# Patient Record
Sex: Male | Born: 1975 | Race: White | Hispanic: No | Marital: Married | Smoking: Never smoker
Health system: Southern US, Community
[De-identification: ages and names within clinical notes are randomized; demographics above are authoritative.]

## PROBLEM LIST (undated history)

## (undated) DIAGNOSIS — E785 Hyperlipidemia, unspecified: Secondary | ICD-10-CM

## (undated) DIAGNOSIS — K219 Gastro-esophageal reflux disease without esophagitis: Secondary | ICD-10-CM

## (undated) DIAGNOSIS — S46212A Strain of muscle, fascia and tendon of other parts of biceps, left arm, initial encounter: Secondary | ICD-10-CM

## (undated) DIAGNOSIS — M6740 Ganglion, unspecified site: Secondary | ICD-10-CM

## (undated) HISTORY — DX: Hyperlipidemia, unspecified: E78.5

## (undated) HISTORY — DX: Gastro-esophageal reflux disease without esophagitis: K21.9

---

## 2009-08-03 NOTE — Progress Notes (Signed)
Ambulatory/Rehab Services H2 Model Falls Risk Assessment    Risk Factor Pts. ??   Confusion/Disorientation/Impulsivity  []   4 ??   Symptomatic Depression  []  2 ??   Altered Elimination  []  1 ??   Dizziness/Vertigo  []  1 ??   Gender (Male)  [x]  1 ??   Any administered antiepileptics (anticonvulsants):  []  2 ??   Any administered benzodiazepines:  []  1 ??   Visual Impairment (specify):  []  1 ??   Portable Oxygen Use  []  1 ??   Orthostatic ? BP  []  1 ??   History of Recent Falls (within 3 mos.)  []  5     Ability to Rise from Chair (choose one) Pts. ??   Ability to rise in a single movement  [x]  0 ??   Pushes up, successful in one attempt  []  1 ??   Multiple attempts, but unsuccessful  []  3 ??   Unable to rise without assistance  []  4   Total: (5 or greater = High Risk) 1     Falls Prevention Plan:   []               Physical Limitations to Exercise (specify):   []               Mobility Assistance Device (type):   []               Exercise/Equipment Adaptation (specify):    ??2010 [employer] All Rights Reserved. [place] Patent #[id number]. Federal Law prohibits the replication, distribution or use without written permission from [employer]

## 2009-08-03 NOTE — Progress Notes (Signed)
[facility] [address]  [address], [address]   [place], [place] [address]  Phone: [phone]   Fax: [phone]    OUTPATIENT ORTHOPAEDIC PHYSICAL THERAPY  [x]   Initial Assessment     []   Daily Note      []   Progress Report       []   Recertification    []   Discharge  []   [name] Fall Risk Score: 1 (5 or greater = High Risk)    NAME/AGE/GENDER: Ronald Fields is a 34 y.o. male  DATE: 08/03/2009  REFERRING PHYSICIAN: Dr. Marlana Fields  Return Physician Appointment: 2 weeks  MEDICAL/REFERRING DIAGNOSIS: left soleus strain  Treatment Diagnosis: 844.8  DATE OF ONSET: 06/25/09   PRIOR LEVEL OF FUNCTION: independent  PRECAUTIONS/ALLERGIES: none reported    SUBJECTIVE     Present Symptoms: Pt complains of intermittent pain posterior left calf. Pt reports difficulty with stairs and prolonged walking.    Pain Intensity 1: 0 (0 Fields   5 at worst)  Pain Location 1: Leg  Pain Orientation 1: Distal;Left;Posterior  History of Present Injury/Illness: Pt reports that he was playing in a rugby match 06/25/09 when he had sudden onset of pain in posterior left calf while running straight line. Pt reports re-injury in another match on 07/16/09. Pt also reports similar injury on the right side in 2009.   Past Medical History: non-contributory  Fields Medications: Ibuprofen   Social History/Home Situation: lives with family      Work/Activity History: Copywriter, advertising    OBJECTIVE     Outcome Measure:   Tool Used: Lower Extremity Functional Scale (LEFS)  Score:  Initial: 57/80   08/03/09 Most Recent: /80   Interpretation of Score: 20 questions each scored on a 5 point scale with 1 representing "extreme difficulty or unable to perform" and 5 representing "no difficulty".  The lower the score, the greater the functional disability. 80/80 represents no disability.  Minimal detectable change is 9 points.  Observation/Orthostatic Postural Assessment: no significant deviations noted       Palpation: mild tenderness lateral posterior calf      ROM:               LLE AROM  L Knee Flexion: 135   L Knee Extension: 0   L Ankle Dorsiflexion: 20  (mild calf discomfort)  L Ankle Plantar Flexion: 50   L Ankle Eversion: 15   L Ankle Inversion: 20     RLE AROM  R Knee Flexion: 135   R Knee Extension: 0   R Ankle Dorsiflexion: 20   R Ankle Plantar Flexion: 50   R Ankle Eversion: 15   R Ankle Inversion: 20       Strength:      LLE Strength  L Knee Flexion: 5  L Knee Extension: 5  L Ankle Dorsiflexion: 4+  L Ankle Plantar Flexion: 4+  L Ankle Eversion: 5  L Ankle Inversion: 5    RLE Strength  R Knee Flexion: 5  R Knee Extension: 5  R Ankle Dorsiflexion: 5  R Ankle Plantar Flexion: 5  R Ankle Eversion: 5  R Ankle Inversion: 5      Special Tests:   Neurological Screen:   Myotomes: intact  Dermatomes: intact  Functional Mobility: minimal antalgic gait on the left      Balance: good        TREATMENT     Manual Therapy (  0):   Therapeutic Exercise ( 10'):   Activity   Date  08/03/09 Date    Parameters Parameters   Toe alphabet x1    Towel scrunches 2-3'    Ankle pumps X 10    Ankle circles X 10    Seated toe raises X 10    Seated heel raises X 10    Incline calf stretch 5 x 10"    HEP: As above; handouts given to patient for all exercises.  Therapeutic Modalities: Calf                                           Left Ankle Electrical Stimulation  Type: Interferential  Intensity: 20   Placement: posterior calf  # of Electrodes: 4   Duration : 12 minutes  Patient Position: Prone                                                    ASSESSMENT     [name] presents with signs and symptoms consistent with left soleus strain. Pt demonstrates good understanding of initial HEP. Pt may benefit from PT to address soft tissue elongation and return to functional activity to include rugby.    Response:  Patient???s response to today???s treatment session was tolerated well with no complications.  Upon completion of treatment, skin condition was clear.  Progression: very likely to progress.     Compliance with Program/Exercises: Will assess as treatment progresses.   Other Comments:    **This section established at most recent assessment**  PROBLEM LIST (Impairments causing functional limitations):  1.  Pain posterior left calf  2.  Pain at end range of left ankle dorsiflexion and plantarflexion.  3.  Tenderness left gastroc/soleus.      GOALS: (Goals have been discussed and agreed upon with patient.)  SHORT-TERM FUNCTIONAL GOALS: Time Frame: 2 weeks  1.  Pain rating of 2-3 with stair climbing and prolonged walking  2.  Minimal antalgic gait  3.  Independent in initial HEP     DISCHARGE GOALS: Time Frame: 4 weeks  1.  Minimal pain with stair climbing and prolonged walking and return to rugby  2.  Normalized gait  3.  Independent in advanced HEP     REHABILITATION POTENTIAL FOR STATED GOALS: Excellent     PLAN OF CARE     INTERVENTIONS PLANNED: (Benefits and precautions of physical therapy have been discussed with the patient.)  1.  Electrical stimulation for pain control and muscle relaxation  2.  Therapeutic exercise for calf stabilization   3.  Manual therapy for muscle elongation and joint flexibility    TREATMENT PLAN EFFECTIVE DATES: 08/03/09 TO 09/02/09  PLAN: Continue to follow patient 2 times a week for 4 weeks to address above goals.  Recommendations/Intent for next treatment session:  Initiate calf stabilization exercise program to include recumbent bike and modalities as above.  PT Patient Time In/Time Out  Time In: 0900  Time Out: 1000    Regarding Ronald Fields's therapy, I certify that the treatment plan above will be carried out by a therapist or under their direction.  Thank you for this referral,  Randon Goldsmith, PT  Physician Signature      Date

## 2009-08-08 NOTE — Progress Notes (Signed)
[facility] at [facility] [address]  [address]   [place], [place] [address]  Phone: [phone]   Fax: [phone]    OUTPATIENT ORTHOPAEDIC PHYSICAL THERAPY  []     Initial Assessment     [x]     Daily Note      []     Progress Report       []     Recertification    []     Discharge  []     [name] Fall Risk Score: 1 (5 or greater = High Risk)    NAME/AGE/GENDER: Ronald Fields is a 34 y.o. male  DATE: 08/08/2009  REFERRING PHYSICIAN: Dr. Marlana Latus  Return Physician Appointment: 2 weeks  MEDICAL/REFERRING DIAGNOSIS: left soleus strain  Treatment Diagnosis: 844.8  DATE OF ONSET: 06/25/09   PRIOR LEVEL OF FUNCTION: independent  PRECAUTIONS/ALLERGIES: none reported    SUBJECTIVE     Present Symptoms: Pt reports mild discomfort first thing in AM, but improves with mobility.  Pt states he did some light jogging last week at rugby with mild discomfort.  Pain Intensity 1: 1 (1 in AM     3 with light jog)  Pain Location 1: Leg  Pain Orientation 1: Distal;Left  History of Present Injury/Illness: Pt reports that he was playing in a rugby match 06/25/09 when he had sudden onset of pain in posterior left calf while running straight line. Pt reports re-injury in another match on 07/16/09. Pt also reports similar injury on the right side in 2009.   Past Medical History: non-contributory  Current Medications: Ibuprofen   Social History/Home Situation: lives with family      Work/Activity History: Copywriter, advertising    OBJECTIVE     Outcome Measure:   Tool Used: Lower Extremity Functional Scale (LEFS)  Score:  Initial: 57/80   08/03/09 Most Recent: /80   Interpretation of Score: 20 questions each scored on a 5 point scale with 1 representing "extreme difficulty or unable to perform" and 5 representing "no difficulty".  The lower the score, the greater the functional disability. 80/80 represents no disability.  Minimal detectable change is 9 points.   Observation/Orthostatic Postural Assessment: no significant deviations noted      Palpation: mild tenderness lateral posterior calf      ROM: NT                          Strength: Nt                 Special Tests:   Neurological Screen:   Myotomes: intact  Dermatomes: intact  Functional Mobility: minimal antalgic gait on the left      Balance: good        TREATMENT     Manual Therapy (     0):   Therapeutic Exercise ( 30'):   Activity   Date  08/03/09 Date  08/08/09    Parameters Parameters   Toe alphabet x1    Towel scrunches 2-3'    Ankle pumps X 10    Ankle circles X 10    Seated toe raises X 10 X 10   Seated heel raises X 10 X 10   Incline calf stretch 5 x 10" 5 x 10"   Recumbent bike  X 10'   Anterior step ups  2 x 10   Lateral step ups  2 x 10   SLS rebounder on pad  2 x 10  Ankle theraband  Blue 4 x 10   Small Fitter disc  Ant/post, lat, and circles  X 10 each   HEP: As above; handouts given to patient for all exercises.  Therapeutic Modalities: (12') Modalities: IFC electrical stimulation to left calf at intensity of 15 while prone.                                                                                                ASSESSMENT     Ronald Fields is able to progress to resistive exercise today with minimal difficulty .   Response:  Patient???s response to today???s treatment session was tolerated well with no complications.  Upon completion of treatment, skin condition was clear.  Progression: very likely to progress.     Compliance with Program/Exercises: Will assess as treatment progresses.   Other Comments:    **This section established at most recent assessment**  PROBLEM LIST (Impairments causing functional limitations):  1.  Pain posterior left calf  2.  Pain at end range of left ankle dorsiflexion and plantarflexion.  3.  Tenderness left gastroc/soleus.      GOALS: (Goals have been discussed and agreed upon with patient.)  SHORT-TERM FUNCTIONAL GOALS: Time Frame: 2 weeks   1.  Pain rating of 2-3 with stair climbing and prolonged walking  2.  Minimal antalgic gait  3.  Independent in initial HEP     DISCHARGE GOALS: Time Frame: 4 weeks  1.  Minimal pain with stair climbing and prolonged walking and return to rugby  2.  Normalized gait  3.  Independent in advanced HEP     REHABILITATION POTENTIAL FOR STATED GOALS: Excellent     PLAN OF CARE     INTERVENTIONS PLANNED: (Benefits and precautions of physical therapy have been discussed with the patient.)  1.  Electrical stimulation for pain control and muscle relaxation  2.  Therapeutic exercise for calf stabilization   3.  Manual therapy for muscle elongation and joint flexibility    TREATMENT PLAN EFFECTIVE DATES: 08/03/09 TO 09/02/09  PLAN: Continue to follow patient 2 times a week for 4 weeks to address above goals.  Recommendations/Intent for next treatment session:  Continue calf stabilization exercise program to include recumbent bike and modalities as above.  PT Patient Time In/Time Out  Time In: 0900  Time Out: 0945    Regarding Ronald Fields's therapy, I certify that the treatment plan above will be carried out by a therapist or under their direction.  Thank you for this referral,  Randon Goldsmith, PT

## 2009-08-17 NOTE — Progress Notes (Signed)
Patient cancelled appointment today, and is rescheduled for later date.    Ronald Fields, PT.

## 2009-09-20 NOTE — Progress Notes (Signed)
[facility] at [facility] [address]  [address]   [place], [place] [address]  Phone: [phone]   Fax: [phone]    OUTPATIENT ORTHOPAEDIC PHYSICAL THERAPY  []      Initial Assessment     []      Daily Note      []      Progress Report       []      Recertification    [x]      Discharge  []      [name] Fall Risk Score: 1 (5 or greater = High Risk)    NAME/AGE/GENDER: Ronald Fields is a 34 y.o. male  DATE: 08/17/2009  REFERRING PHYSICIAN: Dr. Marlana Latus  Return Physician Appointment: 2 weeks  MEDICAL/REFERRING DIAGNOSIS: left soleus strain     DATE OF ONSET: 06/25/09   PRIOR LEVEL OF FUNCTION: independent  PRECAUTIONS/ALLERGIES: none reported    SUBJECTIVE     Present Symptoms: Pt completed 2 sessions of PT and was not seen after appointment 08/08/09.Pt called this week to report that Dr. Marlana Latus had discharged him and would not be continuing PT.     History of Present Injury/Illness: Pt reports that he was playing in a rugby match 06/25/09 when he had sudden onset of pain in posterior left calf while running straight line. Pt reports re-injury in another match on 07/16/09. Pt also reports similar injury on the right side in 2009.   Past Medical History: non-contributory  Current Medications: Ibuprofen   Social History/Home Situation: lives with family      Work/Activity History: Copywriter, advertising    OBJECTIVE     Outcome Measure:   Tool Used: Lower Extremity Functional Scale (LEFS)  Score:  Initial: 57/80   08/03/09 Most Recent: /80 unable to assess 09/20/09   Interpretation of Score: 20 questions each scored on a 5 point scale with 1 representing "extreme difficulty or unable to perform" and 5 representing "no difficulty".  The lower the score, the greater the functional disability. 80/80 represents no disability.  Minimal detectable change is 9 points.  Observation/Orthostatic Postural Assessment: no significant deviations noted       Palpation: mild tenderness lateral posterior calf      ROM: NT                          Strength: Nt                 Special Tests:   Neurological Screen:   Myotomes: intact  Dermatomes: intact  Functional Mobility: minimal antalgic gait on the left      Balance: good        TREATMENT     Manual Therapy (     0):   Therapeutic Exercise ( 30'):   Activity   Date  08/03/09 Date  08/08/09    Parameters Parameters   Toe alphabet x1    Towel scrunches 2-3'    Ankle pumps X 10    Ankle circles X 10    Seated toe raises X 10 X 10   Seated heel raises X 10 X 10   Incline calf stretch 5 x 10" 5 x 10"   Recumbent bike  X 10'   Anterior step ups  2 x 10   Lateral step ups  2 x 10   SLS rebounder on pad  2 x 10   Ankle theraband  Blue 4 x 10  Small Fitter disc  Ant/post, lat, and circles  X 10 each   HEP: As above; handouts given to patient for all exercises.  Therapeutic Modalities: (12') Modalities: IFC electrical stimulation to left calf at intensity of 15 while prone.                                                                                                ASSESSMENT     [name] reported return to full activities and he would not be returning.   Response:  Patient???s response to today???s treatment session was tolerated well with no complications.  Upon completion of treatment, skin condition was clear.  Progression: good    Compliance with Program/Exercises: 2/3 appointments   Other Comments:    **This section established at most recent assessment**  PROBLEM LIST (Impairments causing functional limitations):  1.  Pain posterior left calf  2.  Pain at end range of left ankle dorsiflexion and plantarflexion.  3.  Tenderness left gastroc/soleus.      GOALS: (Goals have been discussed and agreed upon with patient.)  SHORT-TERM FUNCTIONAL GOALS: Time Frame: 2 weeks  1.  Pain rating of 2-3 with stair climbing and prolonged walking Unable to assess 09/20/09  2.  Minimal antalgic gait Unable to assess 09/20/09   3.  Independent in initial HEP Unable to assess 09/20/09     DISCHARGE GOALS: Time Frame: 4 weeks  1.  Minimal pain with stair climbing and prolonged walking and return to rugby Unable to assess 09/20/09  2.  Normalized gait Unable to assess 09/20/09  3.  Independent in advanced HEP   Unable to assess 09/20/09  REHABILITATION POTENTIAL FOR STATED GOALS: Excellent     PLAN OF CARE     TREATMENT DATES: 08/03/09 TO 08/08/09   2/3 appointments  PLAN: Patient is discharged from active PT as he has failed to return for further treatment since last visit 08/08/09, and now reports he has been D/C'd by physician.     Regarding Ronald Fields's therapy, I certify that the treatment plan above will be carried out by a therapist or under their direction.  Thank you for this referral,  Randon Goldsmith, PT

## 2009-10-25 NOTE — Progress Notes (Signed)
[facility] at [facility]  [address] [place], [place] [address]  Phone:[phone]    Fax:[phone]    OUTPATIENT ORTHOPAEDIC PHYSICAL THERAPY  [x] Initial Assessment     [] Daily Note      [] Progress Report       [] Recertification    [] Discharge  [] [name] Fall Risk Score:2 (5 or greater = High Risk)    NAME/AGE/GENDER: Ronald Fields is a 34 y.o. male  Treatment Diagnosis: other disorders of muscle ligament and fascia  DATE: 10/25/2009  REFERRING PHYSICIAN: Dr. Marlana Latus  MD Orders: Eval and Treat  Return Physician Appointment: as needed  MEDICAL/REFERRING DIAGNOSIS: left calf pain  DATE OF ONSET: 10/19/09   PRIOR LEVEL OF FUNCTION: independent  PRECAUTIONS/ALLERGIES: none reported    SUBJECTIVE     Present Symptoms: Pt complains of significant pain in posterior left calf. Pain is more medial than lateral. Pain is worse in weight bearing.   Pain Intensity 1: 4 (4 current   6 at worst)  Pain Location 1: Leg  Pain Orientation 1: Distal;Left;Posterior  History of Present Injury/Illness: Pt reports that he was playing in a rugby match 06/25/09 when he had sudden onset of pain in posterior left calf while running straight line. Pt reports re-injury in another match on 07/16/09. Pt also reports similar injury on the right side in 2009. Pt completed 2 sessions of PT in this facility. Pt reports he returned to rugby, and during straight line running at practice had sudden sharp return of pain in left calf. Pt states MRI indicated partial tear left gastroc.  Past Medical History: non-contributory  Current Medications: Ibuprofen   Social History/Home Situation: lives with family      Work/Activity History: Copywriter, advertising    OBJECTIVE     Outcome Measure:   Tool Used: Lower Extremity Functional Scale (LEFS)  Score:  Initial: --/80 Most Recent: --/80    Interpretation of Score: 20 questions each scored on a 5 point scale with 1 representing "extreme difficulty or unable to perform" and 5 representing "no difficulty".  The lower the score, the greater the functional disability. 80/80 represents no disability.  Minimal detectable change is 9 points.  Observation/Orthostatic Postural Assessment: Pt with noted inversion drift for left foot      Palpation: marked tenderness over medial left gastroc/soleius      ROM:               LLE AROM  L Knee Flexion: 135   L Knee Extension: 0   L Ankle Dorsiflexion: 10   L Ankle Plantar Flexion: 40   L Ankle Eversion: 15   L Ankle Inversion: 30     RLE AROM  R Knee Flexion: 135   R Knee Extension: 0   R Ankle Dorsiflexion: 20   R Ankle Plantar Flexion: 50   R Ankle Eversion: 15   R Ankle Inversion: 35       Strength:      LLE Strength  L Knee Flexion: 5  L Knee Extension: 5  L Ankle Dorsiflexion: 4  L Ankle Plantar Flexion: 4  L Ankle Eversion: 4  L Ankle Inversion: 4    RLE Strength  R Knee Flexion: 5  R Knee Extension: 5  R Ankle Dorsiflexion: 5  R Ankle Plantar Flexion: 5  R Ankle Eversion: 5  R Ankle Inversion: 5      Special Tests:   Neurological Screen:   Myotomes: intact  Dermatomes:intact  Functional Mobility: marked antalgic  gait left LE      Balance: fair        TREATMENT   Evaluation ( 40 min)  Manual Therapy (     0):   Therapeutic Exercise ( 10'):   Activity   Date  10/25/09 Date    Parameters Parameters   Seated heel raises X 10    Seated toe raises X 10    Seated towel calf stretch 5 x 5"    Standing gastroc stretch 5 x 5"    Standing soleus stretch 5 x 5"               HEP: As above; handouts given to patient for all exercises.  Therapeutic Modalities: (0)                                                                                                ASSESSMENT      Ronald Fields presents with acute pain in posterior distal left LE which is consistent with partial tear of the left gastroc. This is the 3rd injury to left calf in 4 months. Pt may benefit from PT to address pain control, ROM and leg/ankle strengthening to return to daily functional activities including rugby.  Response:  Patient???s response to today???s treatment session was tolerated well with no complications.  Upon completion of treatment, skin condition was clear.  Progression: very likely to progress.     Compliance with Program/Exercises: Will assess as treatment progresses.   Other Comments:    **This section established at most recent assessment**  PROBLEM LIST (Impairments causing functional limitations):  1.  Pain in left calf  2.  Decreased ROM left ankle  3.  Decreased strength left ankle  4.  Antalgic gait  GOALS: (Goals have been discussed and agreed upon with patient.)  SHORT-TERM FUNCTIONAL GOALS: Time Frame: 4 weeks  1.  Decrease pain to 2-3 with walking  2.  Full PROM left ankle  3.  Increase strength 1/2 grade  4.  Mild antalgic gait  DISCHARGE GOALS: Time Frame: 6 weeks  1.  Decrease pain to minimal with running  2.  Full AROM left ankle  3.  Increase strength 1 grade  4.  Minimal antalgic gait  REHABILITATION POTENTIAL FOR STATED GOALS: Good     PLAN OF CARE     INTERVENTIONS PLANNED: (Benefits and precautions of physical therapy have been discussed with the patient.)  1.  Electrical stimulation for pain control and muscle relaxation  2.  Therapeutic exercise for LE stabilization   3.  Manual therapy for muscle elongation and joint flexibility    TREATMENT PLAN EFFECTIVE DATES: 10/25/09 TO 12/13/09  PLAN: Continue to follow patient 2 times a week for 6 weeks to address above goals.  Recommendations/Intent for next treatment session:  Initiate ankle stabilization exercise program to include recumbent bike and modalities as above.  PT Patient Time In/Time Out  Time In: 1100  Time Out: 1200     Regarding Ronald Fields's therapy, I certify that the treatment plan above will be carried out by a therapist or under their  direction.  Thank you for this referral,  Randon Goldsmith, PT                      Physician Signature      Date

## 2009-10-26 NOTE — Progress Notes (Signed)
[facility] at [facility]  [address] [place], [place] [address]  [phone]    [phone]    OUTPATIENT ORTHOPAEDIC PHYSICAL THERAPY  []  Initial Assessment     [x]  Daily Note      []  Progress Report       []  Recertification    []  Discharge  []  [name] Fall Risk Score:2 (5 or greater = High Risk)    NAME/AGE/GENDER: Ronald Fields is a 34 y.o. male     DATE: 10/26/2009  REFERRING PHYSICIAN: Dr. Marlana Latus  MD Orders: Eval and Treat  Return Physician Appointment: as needed  MEDICAL/REFERRING DIAGNOSIS: left calf pain  DATE OF ONSET: 10/19/09   PRIOR LEVEL OF FUNCTION: independent  PRECAUTIONS/ALLERGIES: none reported    SUBJECTIVE     Present Symptoms: Pt reports mild improvement in pain in posterior left calf. Gait is less antalgic.   Pain Intensity 1: 3  Pain Location 1: Leg  Pain Orientation 1: Distal;Left;Posterior  History of Present Injury/Illness: Pt reports that he was playing in a rugby match 06/25/09 when he had sudden onset of pain in posterior left calf while running straight line. Pt reports re-injury in another match on 07/16/09. Pt also reports similar injury on the right side in 2009. Pt completed 2 sessions of PT in this facility. Pt reports he returned to rugby, and during straight line running at practice had sudden sharp return of pain in left calf. Pt states MRI indicated partial tear left gastroc.  Past Medical History: non-contributory  Current Medications: Ibuprofen   Social History/Home Situation: lives with family      Work/Activity History: Copywriter, advertising    OBJECTIVE     Outcome Measure:   Tool Used: Lower Extremity Functional Scale (LEFS)  Score:  Initial: --/80 Most Recent: --/80    Interpretation of Score: 20 questions each scored on a 5 point scale with 1 representing "extreme difficulty or unable to perform" and 5 representing "no difficulty".  The lower the score, the greater the functional disability. 80/80 represents no disability.  Minimal detectable change is 9 points.  Observation/Orthostatic Postural Assessment: Pt with noted inversion drift for left foot      Palpation: marked tenderness over medial left gastroc/soleius      ROM: NT                          Strength: NT                 Special Tests:   Neurological Screen:   Myotomes: intact  Dermatomes:intact  Functional Mobility: marked antalgic gait left LE      Balance: fair        TREATMENT     Manual Therapy (     0):   Therapeutic Exercise ( 30'):   Activity   Date  10/25/09 Date  10/26/09    Parameters Parameters   Seated heel raises X 10 X 10   Seated toe raises X 10 X 10   Seated towel calf stretch 5 x 5" --   Standing gastroc stretch 5 x 5" 5 x 5"   Standing soleus stretch 5 x 5" 5 x 5"   Recumbent bike  Level 2 x 10'   Incline calf  5 x 10"   Step ups ant/lat/post  X 10 each   theraband ankle   DF/PF/INV/Ever  Yellow x 10 each         HEP: As above;  handouts given to patient for all exercises.  Therapeutic Modalities: (12') Modalities: IFC electrical stimulation to left calf at intensity of 23 while prone                                                                                                ASSESSMENT     Mr. Cotto initiates resistive exercise with only mild difficulty. Pt reports mild pain improvement post electrical stimulation.Pt will be out of town next week.  Response:  Patient???s response to today???s treatment session was tolerated well with no complications.  Upon completion of treatment, skin condition was clear.  Progression: very likely to progress.     Compliance with Program/Exercises: Will assess as treatment progresses.   Other Comments:    **This section established at most recent assessment**   PROBLEM LIST (Impairments causing functional limitations):  1.  Pain in left calf  2.  Decreased ROM left ankle  3.  Decreased strength left ankle  4.  Antalgic gait  GOALS: (Goals have been discussed and agreed upon with patient.)  SHORT-TERM FUNCTIONAL GOALS: Time Frame: 4 weeks  1.  Decrease pain to 2-3 with walking  2.  Full PROM left ankle  3.  Increase strength 1/2 grade  4.  Mild antalgic gait  DISCHARGE GOALS: Time Frame: 6 weeks  1.  Decrease pain to minimal with running  2.  Full AROM left ankle  3.  Increase strength 1 grade  4.  Minimal antalgic gait  REHABILITATION POTENTIAL FOR STATED GOALS: Good     PLAN OF CARE     INTERVENTIONS PLANNED: (Benefits and precautions of physical therapy have been discussed with the patient.)  1.  Electrical stimulation for pain control and muscle relaxation  2.  Therapeutic exercise for LE stabilization   3.  Manual therapy for muscle elongation and joint flexibility    TREATMENT PLAN EFFECTIVE DATES: 10/25/09 TO 12/13/09  PLAN: Continue to follow patient 2 times a week for 6 weeks to address above goals.  Recommendations/Intent for next treatment session: Continue ankle stabilization exercise program to include recumbent bike and modalities as above.  PT Patient Time In/Time Out  Time In: 0945  Time Out: 1030    Regarding Vibhav Dubow's therapy, I certify that the treatment plan above will be carried out by a therapist or under their direction.  Thank you for this referral,  Randon Goldsmith, PT

## 2009-10-26 NOTE — Progress Notes (Signed)
Ambulatory/Rehab Services H2 Model Falls Risk Assessment    Risk Factor Pts. ??   Confusion/Disorientation/Impulsivity  []   4 ??   Symptomatic Depression  []  2 ??   Altered Elimination  []  1 ??   Dizziness/Vertigo  []  1 ??   Gender (Male)  [x]  1 ??   Any administered antiepileptics (anticonvulsants):  []  2 ??   Any administered benzodiazepines:  []  1 ??   Visual Impairment (specify):  []  1 ??   Portable Oxygen Use  []  1 ??   Orthostatic ? BP  []  1 ??   History of Recent Falls (within 3 mos.)  []  5     Ability to Rise from Chair (choose one) Pts. ??   Ability to rise in a single movement  []  0 ??   Pushes up, successful in one attempt  [x]  1 ??   Multiple attempts, but unsuccessful  []  3 ??   Unable to rise without assistance  []  4   Total: (5 or greater = High Risk) 2     Falls Prevention Plan:   []               Physical Limitations to Exercise (specify):   []               Mobility Assistance Device (type):   []               Exercise/Equipment Adaptation (specify):    ??2010 [employer] All Rights Reserved. [place] Patent #7,[id number]. Federal Law prohibits the replication, distribution or use without written permission from [employer]

## 2009-11-07 NOTE — Progress Notes (Signed)
Patient fails to show for scheduled appointment.    Matt Brinley Treanor, PT.

## 2009-11-10 NOTE — Progress Notes (Signed)
[facility] at [facility]  [address] [place], [place] [address]  Phone:[phone]    [phone]    OUTPATIENT ORTHOPAEDIC PHYSICAL THERAPY  []   Initial Assessment     [x]   Daily Note      []   Progress Report       []   Recertification    []   Discharge  []   [name] Fall Risk Score:2 (5 or greater = High Risk)    NAME/AGE/GENDER: Ronald Fields is a 34 y.o. male     DATE: 11/10/2009  REFERRING PHYSICIAN: Dr. Marlana Latus  MD Orders: Eval and Treat  Return Physician Appointment: as needed  MEDICAL/REFERRING DIAGNOSIS: left calf pain  DATE OF ONSET: 10/19/09   PRIOR LEVEL OF FUNCTION: independent  PRECAUTIONS/ALLERGIES: none reported    SUBJECTIVE     Present Symptoms: Pt reports he was out of town on vacation.   Pain Intensity 1: 3  Pain Location 1: Leg  Pain Orientation 1: Distal;Left  History of Present Injury/Illness: Pt reports that he was playing in a rugby match 06/25/09 when he had sudden onset of pain in posterior left calf while running straight line. Pt reports re-injury in another match on 07/16/09. Pt also reports similar injury on the right side in 2009. Pt completed 2 sessions of PT in this facility. Pt reports he returned to rugby, and during straight line running at practice had sudden sharp return of pain in left calf. Pt states MRI indicated partial tear left gastroc.  Past Medical History: non-contributory  Current Medications: Ibuprofen   Social History/Home Situation: lives with family      Work/Activity History: Copywriter, advertising    OBJECTIVE     Outcome Measure:   Tool Used: Lower Extremity Functional Scale (LEFS)  Score:  Initial: --/80 Most Recent: --/80   Interpretation of Score: 20 questions each scored on a 5 point scale with 1 representing "extreme difficulty or unable to perform" and 5 representing "no difficulty".  The lower the score, the greater the functional disability. 80/80 represents no disability.  Minimal detectable change is 9 points.   Observation/Orthostatic Postural Assessment: Pt with noted inversion drift for left foot      Palpation: marked tenderness over medial left gastroc/soleius      ROM: NT                          Strength: NT                 Special Tests:   Neurological Screen:   Myotomes: intact  Dermatomes:intact  Functional Mobility: marked antalgic gait left LE      Balance: fair        TREATMENT     Manual Therapy (     0):   Therapeutic Exercise ( 30'):   Activity   Date  10/25/09 Date  10/26/09 Date  11/10/09    Parameters Parameters Parameters   Seated heel raises X 10 X 10 --   Seated toe raises X 10 X 10 --   Seated towel calf stretch 5 x 5" -- --   Standing gastroc stretch 5 x 5" 5 x 5" --   Standing soleus stretch 5 x 5" 5 x 5" --   Recumbent bike  Level 2 x 10' Level 2 x 10'   Incline calf  5 x 10" 5 x 10"   Step ups ant/lat/post  X 10 each X 10 each BLE  theraband ankle   DF/PF/INV/Ever  Yellow x 10 each Blue x 10   Lg Fitter disc  PF/DF, INV/EV, circles   X 10 each   lunge onto BOSU   X 10 BLE   Shuttle leg press   50# x 10   Shuttle calf raise   50# x 10    HEP: As above; handouts given to patient for all exercises.  Therapeutic Modalities: (12') Modalities: IFC electrical stimulation to left calf at intensity of 23 while prone                                                                                                ASSESSMENT     Ronald Fields increases resistive exercise with only mild difficulty. Gait pattern is less antalgic.  Response:  Patient???s response to today???s treatment session was tolerated well with no complications.  Upon completion of treatment, skin condition was clear.  Progression: very likely to progress.     Compliance with Program/Exercises: Will assess as treatment progresses.   Other Comments:    **This section established at most recent assessment**  PROBLEM LIST (Impairments causing functional limitations):  1.  Pain in left calf  2.  Decreased ROM left ankle   3.  Decreased strength left ankle  4.  Antalgic gait  GOALS: (Goals have been discussed and agreed upon with patient.)  SHORT-TERM FUNCTIONAL GOALS: Time Frame: 4 weeks  1.  Decrease pain to 2-3 with walking  2.  Full PROM left ankle  3.  Increase strength 1/2 grade  4.  Mild antalgic gait  DISCHARGE GOALS: Time Frame: 6 weeks  1.  Decrease pain to minimal with running  2.  Full AROM left ankle  3.  Increase strength 1 grade  4.  Minimal antalgic gait  REHABILITATION POTENTIAL FOR STATED GOALS: Good     PLAN OF CARE     INTERVENTIONS PLANNED: (Benefits and precautions of physical therapy have been discussed with the patient.)  1.  Electrical stimulation for pain control and muscle relaxation  2.  Therapeutic exercise for LE stabilization   3.  Manual therapy for muscle elongation and joint flexibility    TREATMENT PLAN EFFECTIVE DATES: 10/25/09 TO 12/13/09  PLAN: Continue to follow patient 2 times a week for 6 weeks to address above goals.  Recommendations/Intent for next treatment session: Continue ankle stabilization exercise program to include recumbent bike and modalities as above.  PT Patient Time In/Time Out  Time In: 0800  Time Out: 0845    Regarding Ronald Fields's therapy, I certify that the treatment plan above will be carried out by a therapist or under their direction.  Thank you for this referral,  Randon Goldsmith, PT

## 2009-11-14 NOTE — Progress Notes (Signed)
[facility] at [facility]  [address] [place], [place] [address]  [phone]    Fax:[phone]    OUTPATIENT ORTHOPAEDIC PHYSICAL THERAPY  []    Initial Assessment     [x]    Daily Note      []    Progress Report       []    Recertification    []    Discharge  []    [name] Fall Risk Score:2 (5 or greater = High Risk)    NAME/AGE/GENDER: Ronald Fields is a 34 y.o. male     DATE: 11/14/2009  REFERRING PHYSICIAN: Dr. Marlana Latus  MD Orders: Eval and Treat  Return Physician Appointment: as needed  MEDICAL/REFERRING DIAGNOSIS: left calf pain  DATE OF ONSET: 10/19/09   PRIOR LEVEL OF FUNCTION: independent  PRECAUTIONS/ALLERGIES: none reported    SUBJECTIVE     Present Symptoms: Pt reports soreness now mainly in AM.   Pain Intensity 1: 2  Pain Location 1: Leg  Pain Orientation 1: Distal;Left  History of Present Injury/Illness: Pt reports that he was playing in a rugby match 06/25/09 when he had sudden onset of pain in posterior left calf while running straight line. Pt reports re-injury in another match on 07/16/09. Pt also reports similar injury on the right side in 2009. Pt completed 2 sessions of PT in this facility. Pt reports he returned to rugby, and during straight line running at practice had sudden sharp return of pain in left calf. Pt states MRI indicated partial tear left gastroc.  Past Medical History: non-contributory  Current Medications: Ibuprofen   Social History/Home Situation: lives with family      Work/Activity History: Copywriter, advertising    OBJECTIVE     Outcome Measure:   Tool Used: Lower Extremity Functional Scale (LEFS)  Score:  Initial: --/80 Most Recent: --/80   Interpretation of Score: 20 questions each scored on a 5 point scale with 1 representing "extreme difficulty or unable to perform" and 5 representing "no difficulty".  The lower the score, the greater the functional disability. 80/80 represents no disability.  Minimal detectable change is 9 points.   Observation/Orthostatic Postural Assessment: Pt with noted inversion drift for left foot      Palpation: mild tenderness over medial left gastroc/soleius      ROM: NT                          Strength: NT                 Special Tests:   Neurological Screen:   Myotomes: intact  Dermatomes:intact  Functional Mobility: mild antalgic gait left LE      Balance: fair        TREATMENT     Manual Therapy (     0):   Therapeutic Exercise ( 30'):   Activity   Date  10/25/09 Date  10/26/09 Date  11/10/09 Date  11/14/09    Parameters Parameters Parameters Parameters   Seated heel raises X 10 X 10 -- --   Seated toe raises X 10 X 10 -- --   Seated towel calf stretch 5 x 5" -- -- --   Standing gastroc stretch 5 x 5" 5 x 5" -- --   Standing soleus stretch 5 x 5" 5 x 5" -- --   Recumbent bike  Level 2 x 10' Level 2 x 10' Level 2 x 10'   Incline calf  5 x 10" 5  x 10" 5 x 10"   Step ups ant/lat/post  X 10 each X 10 each BLE X 10 each BLE   theraband ankle   DF/PF/INV/Ever  Yellow x 10 each Blue x 10 Blue x 10   Lg Fitter disc  PF/DF, INV/EV, circles   X 10 each X 10 each   lunge onto BOSU   X 10 BLE X 10 BLE   Shuttle leg press   50# x 10 50# x 10   Shuttle calf raise   50# x 10 50# x 10   Shuttle leg thrusts    50# x 10    HEP: As above; handouts given to patient for all exercises.  Therapeutic Modalities: (12') Modalities: IFC electrical stimulation to left calf at intensity of 23 while prone                                                                                                ASSESSMENT     [name] continues to demonstrate steady improvement in gait pattern.Tenderness is also significantly improved today.  Response:  Patient???s response to today???s treatment session was tolerated well with no complications.  Upon completion of treatment, skin condition was clear.  Progression: very likely to progress.     Compliance with Program/Exercises: Will assess as treatment progresses.   Other Comments:     **This section established at most recent assessment**  PROBLEM LIST (Impairments causing functional limitations):  1.  Pain in left calf  2.  Decreased ROM left ankle  3.  Decreased strength left ankle  4.  Antalgic gait  GOALS: (Goals have been discussed and agreed upon with patient.)  SHORT-TERM FUNCTIONAL GOALS: Time Frame: 4 weeks  1.  Decrease pain to 2-3 with walking  2.  Full PROM left ankle  3.  Increase strength 1/2 grade  4.  Mild antalgic gait  DISCHARGE GOALS: Time Frame: 6 weeks  1.  Decrease pain to minimal with running  2.  Full AROM left ankle  3.  Increase strength 1 grade  4.  Minimal antalgic gait  REHABILITATION POTENTIAL FOR STATED GOALS: Good     PLAN OF CARE     INTERVENTIONS PLANNED: (Benefits and precautions of physical therapy have been discussed with the patient.)  1.  Electrical stimulation for pain control and muscle relaxation  2.  Therapeutic exercise for LE stabilization   3.  Manual therapy for muscle elongation and joint flexibility    TREATMENT PLAN EFFECTIVE DATES: 10/25/09 TO 12/13/09  PLAN: Continue to follow patient 2 times a week for 6 weeks to address above goals.  Recommendations/Intent for next treatment session: Continue ankle stabilization exercise program to include recumbent bike and modalities as above.  PT Patient Time In/Time Out  Time In: 0845  Time Out: 0930    Regarding Ronald Fields's therapy, I certify that the treatment plan above will be carried out by a therapist or under their direction.  Thank you for this referral,  Ronald Fields, PT

## 2009-11-15 NOTE — Progress Notes (Signed)
[facility] Out[facility] Rehab at [facility]  [address], [place], [place] [address]  [phone]    Fax:[phone]    OUTPATIENT ORTHOPAEDIC PHYSICAL THERAPY  []     Initial Assessment     [x]     Daily Note      []     Progress Report       []     Recertification    []     Discharge  []     [name] Fall Risk Score:2 (5 or greater = High Risk)    NAME/AGE/GENDER: Ronald Fields is a 34 y.o. male     DATE: 11/15/2009  REFERRING PHYSICIAN: Dr. Marlana Latus  MD Orders: Eval and Treat  Return Physician Appointment: as needed  MEDICAL/REFERRING DIAGNOSIS: left calf pain  DATE OF ONSET: 10/19/09   PRIOR LEVEL OF FUNCTION: independent  PRECAUTIONS/ALLERGIES: none reported    SUBJECTIVE     Present Symptoms: Pt reports steady decrease in pain levels. Pt reports completing light jog this morning with minimal pain.   Pain Intensity 1: 1  Pain Location 1: Leg  Pain Orientation 1: Distal;Left  History of Present Injury/Illness: Pt reports that he was playing in a rugby match 06/25/09 when he had sudden onset of pain in posterior left calf while running straight line. Pt reports re-injury in another match on 07/16/09. Pt also reports similar injury on the right side in 2009. Pt completed 2 sessions of PT in this facility. Pt reports he returned to rugby, and during straight line running at practice had sudden sharp return of pain in left calf. Pt states MRI indicated partial tear left gastroc.  Past Medical History: non-contributory  Current Medications: Ibuprofen   Social History/Home Situation: lives with family      Work/Activity History: Copywriter, advertising    OBJECTIVE     Outcome Measure:   Tool Used: Lower Extremity Functional Scale (LEFS)  Score:  Initial: --/80 Most Recent: --/80    Interpretation of Score: 20 questions each scored on a 5 point scale with 1 representing "extreme difficulty or unable to perform" and 5 representing "no difficulty".  The lower the score, the greater the functional disability. 80/80 represents no disability.  Minimal detectable change is 9 points.  Observation/Orthostatic Postural Assessment: Pt with noted inversion drift for left foot      Palpation: mild tenderness over medial left gastroc/soleius      ROM: NT                          Strength: NT                 Special Tests:   Neurological Screen:   Myotomes: intact  Dermatomes:intact  Functional Mobility: mild antalgic gait left LE      Balance: fair        TREATMENT     Manual Therapy (     0):   Therapeutic Exercise ( 30'):   Activity   Date  11/10/09 Date  11/14/09 Date  11/15/09    Parameters Parameters Parameters   Recumbent bike Level 2 x 10' Level 2 x 10' Level 2 x 10'   Incline calf 5 x 10" 5 x 10" 5 x 10"   Wall sits   10 x 10"   Step ups ant/lat/post X 10 each BLE X 10 each BLE X 10 each BLE   theraband ankle   DF/PF/INV/Ever Blue x 10 Blue x 10 Blue x 10   Lg  Fitter disc  PF/DF, INV/EV, circles X 10 each X 10 each X 10 each   lunge onto BOSU X 10 BLE X 10 BLE X 10 BLE   Shuttle leg press 50# x 10 50# x 10 75# x 10   Shuttle calf raise 50# x 10 50# x 10 75# x 10   Shuttle leg thrusts  50# x 10 75# x 20    HEP: As above; handouts given to patient for all exercises.  Therapeutic Modalities: (12') Modalities: IFC electrical stimulation to left calf at intensity of 20 while prone                                                                                                ASSESSMENT     [name] is returning to light jogging now. Will slowly continue to increase activity.  Response:  Patient???s response to today???s treatment session was tolerated well with no complications.  Upon completion of treatment, skin condition was clear.  Progression: very likely to progress.      Compliance with Program/Exercises: Will assess as treatment progresses.   Other Comments:    **This section established at most recent assessment**  PROBLEM LIST (Impairments causing functional limitations):  1.  Pain in left calf  2.  Decreased ROM left ankle  3.  Decreased strength left ankle  4.  Antalgic gait  GOALS: (Goals have been discussed and agreed upon with patient.)  SHORT-TERM FUNCTIONAL GOALS: Time Frame: 4 weeks  1.  Decrease pain to 2-3 with walking  2.  Full PROM left ankle  3.  Increase strength 1/2 grade  4.  Mild antalgic gait  DISCHARGE GOALS: Time Frame: 6 weeks  1.  Decrease pain to minimal with running  2.  Full AROM left ankle  3.  Increase strength 1 grade  4.  Minimal antalgic gait  REHABILITATION POTENTIAL FOR STATED GOALS: Good     PLAN OF CARE     INTERVENTIONS PLANNED: (Benefits and precautions of physical therapy have been discussed with the patient.)  1.  Electrical stimulation for pain control and muscle relaxation  2.  Therapeutic exercise for LE stabilization   3.  Manual therapy for muscle elongation and joint flexibility    TREATMENT PLAN EFFECTIVE DATES: 10/25/09 TO 12/13/09  PLAN: Continue to follow patient 2 times a week for 6 weeks to address above goals.  Recommendations/Intent for next treatment session: Continue ankle stabilization exercise program to include recumbent bike and modalities as above.  PT Patient Time In/Time Out  Time In: 0800  Time Out: 0845    Regarding [name] therapy, I certify that the treatment plan above will be carried out by a therapist or under their direction.  Thank you for this referral,  Randon Goldsmith, PT

## 2009-11-24 NOTE — Progress Notes (Signed)
[facility] at [facility]  [address] [place], [place] [address]  Phone:[phone]    Fax:[phone]    OUTPATIENT ORTHOPAEDIC PHYSICAL THERAPY  []      Initial Assessment     [x]      Daily Note      []      Progress Report       []      Recertification    []      Discharge  []      [name] Fall Risk Score:2 (5 or greater = High Risk)    NAME/AGE/GENDER: Ronald Fields is a 34 y.o. male     DATE: 11/24/2009  REFERRING PHYSICIAN: Dr. Marlana Latus  MD Orders: Eval and Treat  Return Physician Appointment: as needed  MEDICAL/REFERRING DIAGNOSIS: left calf pain  DATE OF ONSET: 10/19/09   PRIOR LEVEL OF FUNCTION: independent  PRECAUTIONS/ALLERGIES: none reported    SUBJECTIVE     Present Symptoms: Pt reports steady decrease in pain levels for left leg. Pt reports moderate pain in right distal LE today. Pt reports playing rugby in tournament over the week-end without incident.  Pain Intensity 1: 2  Pain Location 1: Leg  Pain Orientation 1: Distal  History of Present Injury/Illness: Pt reports that he was playing in a rugby match 06/25/09 when he had sudden onset of pain in posterior left calf while running straight line. Pt reports re-injury in another match on 07/16/09. Pt also reports similar injury on the right side in 2009. Pt completed 2 sessions of PT in this facility. Pt reports he returned to rugby, and during straight line running at practice had sudden sharp return of pain in left calf. Pt states MRI indicated partial tear left gastroc.  Past Medical History: non-contributory  Current Medications: Ibuprofen   Social History/Home Situation: lives with family      Work/Activity History: Copywriter, advertising    OBJECTIVE     Outcome Measure:   Tool Used: Lower Extremity Functional Scale (LEFS)  Score:  Initial: --/80 Most Recent: --/80    Interpretation of Score: 20 questions each scored on a 5 point scale with 1 representing "extreme difficulty or unable to perform" and 5 representing "no difficulty".  The lower the score, the greater the functional disability. 80/80 represents no disability.  Minimal detectable change is 9 points.  Observation/Orthostatic Postural Assessment: Pt with noted inversion drift for left foot      Palpation: minimal tenderness over medial left gastroc/soleius      ROM: NT                          Strength: NT                 Special Tests:   Neurological Screen:   Myotomes: intact  Dermatomes:intact  Functional Mobility: mild antalgic gait left LE      Balance: fair        TREATMENT     Manual Therapy (     0):   Therapeutic Exercise ( 40'):   Activity   Date  11/10/09 Date  11/14/09 Date  11/15/09 Date  11/24/09    Parameters Parameters Parameters Parameters   Recumbent bike Level 2 x 10' Level 2 x 10' Level 2 x 10' Level 2 x 10'   Incline calf 5 x 10" 5 x 10" 5 x 10" 5 x 10"   Wall sits   10 x 10" 10 x 10"   Step ups  ant/lat/post X 10 each BLE X 10 each BLE X 10 each BLE X 10 each BLE   theraband ankle   DF/PF/INV/Ever Blue x 10 Blue x 10 Blue x 10 --   Lg Fitter disc  PF/DF, INV/EV, circles X 10 each X 10 each X 10 each X 10 each   lunge onto BOSU X 10 BLE X 10 BLE X 10 BLE X 10 BLE   BOSU squats    X 10   SLS clocks    X 5 BLE   Shuttle leg press 50# x 10 50# x 10 75# x 10 75# x 10   Shuttle calf raise 50# x 10 50# x 10 75# x 10 75# x 10   Shuttle leg thrusts  50# x 10 75# x 20 75# x 10    HEP: As above; handouts given to patient for all exercises.  Therapeutic Modalities: (0') Held today: IFC electrical stimulation to left calf at intensity of 20 while prone                                                                                                ASSESSMENT     Mr. Hadden completed return to rugby with minimal difficulty although not full force.Will continue to progress LE stabilization.   Response:  Patient???s response to today???s treatment session was tolerated well with no complications.  Upon completion of treatment, skin condition was clear.  Progression: very likely to progress.     Compliance with Program/Exercises: Will assess as treatment progresses.   Other Comments:    **This section established at most recent assessment**  PROBLEM LIST (Impairments causing functional limitations):  1.  Pain in left calf  2.  Decreased ROM left ankle  3.  Decreased strength left ankle  4.  Antalgic gait  GOALS: (Goals have been discussed and agreed upon with patient.)  SHORT-TERM FUNCTIONAL GOALS: Time Frame: 4 weeks  1.  Decrease pain to 2-3 with walking  2.  Full PROM left ankle  3.  Increase strength 1/2 grade  4.  Mild antalgic gait  DISCHARGE GOALS: Time Frame: 6 weeks  1.  Decrease pain to minimal with running  2.  Full AROM left ankle  3.  Increase strength 1 grade  4.  Minimal antalgic gait  REHABILITATION POTENTIAL FOR STATED GOALS: Good     PLAN OF CARE     INTERVENTIONS PLANNED: (Benefits and precautions of physical therapy have been discussed with the patient.)  1.  Electrical stimulation for pain control and muscle relaxation  2.  Therapeutic exercise for LE stabilization   3.  Manual therapy for muscle elongation and joint flexibility    TREATMENT PLAN EFFECTIVE DATES: 10/25/09 TO 12/13/09  PLAN: Continue to follow patient 2 times a week for 6 weeks to address above goals.  Recommendations/Intent for next treatment session: Continue ankle stabilization exercise program to include recumbent bike and modalities as above.  PT Patient Time In/Time Out  Time In: 0800  Time Out: 0845    Regarding Ronald Fields's therapy, I certify that the treatment plan above  will be carried out by a therapist or under their direction.  Thank you for this referral,  Leone Brand, PT

## 2009-11-29 NOTE — Progress Notes (Addendum)
[facility] at [facility]  [address] [place], [place] [address]  [phone]    [phone]    OUTPATIENT ORTHOPAEDIC PHYSICAL THERAPY  []         Initial Assessment     []         Daily Note      [x]         Progress Report       []         Recertification    []         Discharge  []         [name] Fall Risk Score:2 (5 or greater = High Risk)    NAME/AGE/GENDER: Ronald Fields is a 34 y.o. male     DATE: 11/29/2009  REFERRING PHYSICIAN: Dr. Marlana Latus  MD Orders: Eval and Treat  Return Physician Appointment: as needed  MEDICAL/REFERRING DIAGNOSIS: left calf pain  DATE OF ONSET: 10/19/09   PRIOR LEVEL OF FUNCTION: independent  PRECAUTIONS/ALLERGIES: none reported    SUBJECTIVE     Present Symptoms: Pt reports no pain with gym or jogging.  Pain Intensity 1: 0  Pain Location 1: Leg  Pain Orientation 1: Distal  History of Present Injury/Illness: Pt reports that he was playing in a rugby match 06/25/09 when he had sudden onset of pain in posterior left calf while running straight line. Pt reports re-injury in another match on 07/16/09. Pt also reports similar injury on the right side in 2009. Pt completed 2 sessions of PT in this facility. Pt reports he returned to rugby, and during straight line running at practice had sudden sharp return of pain in left calf. Pt states MRI indicated partial tear left gastroc.  Past Medical History: non-contributory  Current Medications: Ibuprofen   Social History/Home Situation: lives with family      Work/Activity History: Copywriter, advertising    OBJECTIVE     Outcome Measure:   Tool Used: Lower Extremity Functional Scale (LEFS)  Score:  Initial: --/80 Most Recent: --/80    Interpretation of Score: 20 questions each scored on a 5 point scale with 1 representing "extreme difficulty or unable to perform" and 5 representing "no difficulty".  The lower the score, the greater the functional disability. 80/80 represents no disability.  Minimal detectable change is 9 points.  Observation/Orthostatic Postural Assessment: Pt with noted inversion drift for left foot      Palpation: minimal tenderness over medial left gastroc/soleius      ROM: NT              LLE AROM  L Knee Flexion: 135   L Knee Extension: 0   L Ankle Dorsiflexion: 20   L Ankle Plantar Flexion: 50   L Ankle Eversion: 15   L Ankle Inversion: 35            Strength: NT     LLE Strength  L Knee Flexion: 5  L Knee Extension: 5  L Ankle Dorsiflexion: 5  L Ankle Plantar Flexion: 5  L Ankle Eversion: 5  L Ankle Inversion: 5           Special Tests:   Neurological Screen:   Myotomes: intact  Dermatomes:intact  Functional Mobility: mild antalgic gait left LE      Balance: fair        TREATMENT     Manual Therapy (     0):   Therapeutic Exercise ( 40'):   Activity   Date  11/10/09 Date  11/14/09 Date  11/15/09 Date  11/24/09 Date  11/29/09    Parameters Parameters Parameters Parameters Parameters   Recumbent bike Level 2 x 10' Level 2 x 10' Level 2 x 10' Level 2 x 10' Level 2 x 10'   Incline calf 5 x 10" 5 x 10" 5 x 10" 5 x 10" 5 x 10"   Wall sits   10 x 10" 10 x 10" 10 x 10"   Step ups ant/lat/post X 10 each BLE X 10 each BLE X 10 each BLE X 10 each BLE X 10 each BLE   theraband ankle   DF/PF/INV/Ever Blue x 10 Blue x 10 Blue x 10 -- Blue x 10   Lg Fitter disc  PF/DF, INV/EV, circles X 10 each X 10 each X 10 each X 10 each --   lunge onto BOSU X 10 BLE X 10 BLE X 10 BLE X 10 BLE X 10 BLE   BOSU squats    X 10 X 10   SLS clocks    X 5 BLE X 5 BLE   Shuttle leg press 50# x 10 50# x 10 75# x 10 75# x 10 75# x 10   Shuttle calf raise 50# x 10 50# x 10 75# x 10 75# x 10 75# x 10   Shuttle leg thrusts  50# x 10 75# x 20 75# x 10 75# x 10     HEP: As above; handouts given to patient for all exercises.  Therapeutic Modalities: (0') Held today: IFC electrical stimulation to left calf at intensity of 20 while prone                                                                                                ASSESSMENT     Mr. Vogelsang completed return to rugby with minimal difficulty although not full force.Given the repeat injuries, it may be beneficial to complete a motion gait analysis to determine if gait abnormality is contributing.   Response:  Patient???s response to today???s treatment session was tolerated well with no complications.  Upon completion of treatment, skin condition was clear.  Progression: very likely to progress.     Compliance with Program/Exercises: 7/8 appointments.   Other Comments:    **This section established at most recent assessment**  PROBLEM LIST (Impairments causing functional limitations):  1.  Pain in left calf  2.  Decreased ROM left ankle  3.  Decreased strength left ankle  4.  Antalgic gait  GOALS: (Goals have been discussed and agreed upon with patient.)  SHORT-TERM FUNCTIONAL GOALS: Time Frame: 4 weeks  1.  Decrease pain to 2-3 with walking  2.  Full PROM left ankle  3.  Increase strength 1/2 grade  4.  Mild antalgic gait  DISCHARGE GOALS: Time Frame: 6 weeks  1.  Decrease pain to minimal with running  2.  Full AROM left ankle  3.  Increase strength 1 grade  4.  Minimal antalgic gait  REHABILITATION POTENTIAL FOR STATED GOALS: Good     PLAN OF CARE     INTERVENTIONS PLANNED: (Benefits and  precautions of physical therapy have been discussed with the patient.)  1.  Electrical stimulation for pain control and muscle relaxation  2.  Therapeutic exercise for LE stabilization   3.  Manual therapy for muscle elongation and joint flexibility    TREATMENT PLAN EFFECTIVE DATES: 10/25/09 TO 12/13/09  PLAN: Continue to follow patient 2 times a week for 6 weeks to address above goals.   Recommendations/Intent for next treatment session: Continue ankle stabilization exercise program to include recumbent bike and modalities as above.  PT Patient Time In/Time Out  Time In: 0800  Time Out: 0845    Regarding Donaven Quillin's therapy, I certify that the treatment plan above will be carried out by a therapist or under their direction.  Thank you for this referral,  Randon Goldsmith, PT      Physician Signature: __________________________Date: ______________

## 2009-12-09 NOTE — Progress Notes (Signed)
[facility] at [facility]  [address] [place], [place] [address]  Phone:[phone]    [phone]    OUTPATIENT ORTHOPAEDIC PHYSICAL THERAPY  []             Initial Assessment     [x]             Daily Note      []             Progress Report       []             Recertification    []             Discharge  []             [name] Fall Risk Score:2 (5 or greater = High Risk)    NAME/AGE/GENDER: Ronald Fields is a 34 y.o. male     DATE: 12/09/2009  REFERRING PHYSICIAN: Dr. Marlana Latus  MD Orders: Eval and Treat  Return Physician Appointment: as needed  MEDICAL/REFERRING DIAGNOSIS: left calf pain  DATE OF ONSET: 10/19/09   PRIOR LEVEL OF FUNCTION: independent  PRECAUTIONS/ALLERGIES: none reported    SUBJECTIVE   Present Symptoms: Patient reports that he is doing pretty good. States that he just has calf soreness.   Pain Intensity 1:  (0-1)  History of Present Injury/Illness: Pt reports that he was playing in a rugby match 06/25/09 when he had sudden onset of pain in posterior left calf while running straight line. Pt reports re-injury in another match on 07/16/09. Pt also reports similar injury on the right side in 2009. Pt completed 2 sessions of PT in this facility. Pt reports he returned to rugby, and during straight line running at practice had sudden sharp return of pain in left calf. Pt states MRI indicated partial tear left gastroc.  Past Medical History: non-contributory  Current Medications: Ibuprofen   Social History/Home Situation: lives with family      Work/Activity History: Copywriter, advertising    OBJECTIVE     Outcome Measure:   Tool Used: Lower Extremity Functional Scale (LEFS)  Score:  Initial: --/80 Most Recent: --/80    Interpretation of Score: 20 questions each scored on a 5 point scale with 1 representing "extreme difficulty or unable to perform" and 5 representing "no difficulty".  The lower the score, the greater the functional disability. 80/80 represents no disability.  Minimal detectable change is 9 points.  Observation/Orthostatic Postural Assessment:   Running analysis performed using [employer] software:   ?? Normal running mechanics at the hip and knee; however, he tends to be a forefoot runner even at slower speeds (L>R). This requires greater eccentric calf strength which he is lacking on the L side.  Foot analysis performed using the pedograph: normal foot type bilateral   Palpation: minimal tenderness over medial left gastroc/soleius      ROM: NT                          Strength: NT     LLE Strength  L Ankle Plantar Flexion: 4    RLE Strength  R Ankle Plantar Flexion: 5      Special Tests:   Neurological Screen:   Myotomes: intact  Dermatomes:intact  Functional Mobility: mild antalgic gait left LE      Balance: fair        TREATMENT   Education: discussed the results of his running and foot analysis, also talked about a home program for  progression of eccentric calf strength to include single leg eccentric contractions then single leg toe jumps then single toe jumps on an unstable surface and then toe cutting drills.   Manual Therapy (     0):   Therapeutic Exercise (45 Minutes: education list above  Activity   Date  11/10/09 Date  11/14/09 Date  11/15/09 Date  11/24/09 Date  11/29/09 Date  12-09-09    Parameters Parameters Parameters Parameters Parameters    Recumbent bike Level 2 x 10' Level 2 x 10' Level 2 x 10' Level 2 x 10' Level 2 x 10' 5 min warm up   Incline calf 5 x 10" 5 x 10" 5 x 10" 5 x 10" 5 x 10" 30 sec   Wall sits   10 x 10" 10 x 10" 10 x 10"    Step ups ant/lat/post X 10 each BLE X 10 each BLE X 10 each BLE X 10 each BLE X 10 each BLE    theraband ankle    DF/PF/INV/Ever Blue x 10 Blue x 10 Blue x 10 -- Blue x 10    Lg Fitter disc  PF/DF, INV/EV, circles X 10 each X 10 each X 10 each X 10 each --    lunge onto BOSU X 10 BLE X 10 BLE X 10 BLE X 10 BLE X 10 BLE    BOSU squats    X 10 X 10    SLS clocks    X 5 BLE X 5 BLE    Shuttle leg press 50# x 10 50# x 10 75# x 10 75# x 10 75# x 10    Shuttle calf raise 50# x 10 50# x 10 75# x 10 75# x 10 75# x 10    Shuttle leg thrusts  50# x 10 75# x 20 75# x 10 75# x 10    Single leg eccentric calf drops      10x, bilateral, 2 sets   Treadmill run      10 min (done for running analysis)    HEP: As above; handouts given to patient for all exercises.  Therapeutic Modalities: (0')                                                                                                 ASSESSMENT   [name] tolerated the session well. He was educated on the things listed above which he displayed full understanding of.    Response:  Patient???s response to today???s treatment session was tolerated well with no complications.  Upon completion of treatment, skin condition was clear.  Progression: very likely to progress.     Compliance with Program/Exercises: 7/8 appointments.   Other Comments:    **This section established at most recent assessment**  PROBLEM LIST (Impairments causing functional limitations):  1.  Pain in left calf  2.  Decreased ROM left ankle  3.  Decreased strength left ankle  4.  Antalgic gait  GOALS: (Goals have been discussed and agreed upon with patient.)  SHORT-TERM FUNCTIONAL GOALS: Time Frame: 4 weeks  1.  Decrease pain to 2-3 with walking  2.  Full PROM left ankle  3.  Increase strength 1/2 grade  4.  Mild antalgic gait  DISCHARGE GOALS: Time Frame: 6 weeks  1.  Decrease pain to minimal with running  2.  Full AROM left ankle  3.  Increase strength 1 grade  4.  Minimal antalgic gait  REHABILITATION POTENTIAL FOR STATED GOALS: Good     PLAN OF CARE      INTERVENTIONS PLANNED: (Benefits and precautions of physical therapy have been discussed with the patient.)  1.  Electrical stimulation for pain control and muscle relaxation  2.  Therapeutic exercise for LE stabilization   3.  Manual therapy for muscle elongation and joint flexibility    TREATMENT PLAN EFFECTIVE DATES: 10/25/09 TO 12/13/09  PLAN: Continue to follow patient 2 times a week for 6 weeks to address above goals.  Recommendations/Intent for next treatment session: Continue ankle stabilization exercise program to include recumbent bike and modalities as above.  PT Patient Time In/Time Out  Time In: 0800  Time Out: 0900    Regarding Ronald Fields's therapy, I certify that the treatment plan above will be carried out by a therapist or under their direction.  Thank you for this referral,  Carolee Rota, PT, DPT      Physician Signature: __________________________Date: ______________

## 2009-12-21 NOTE — Progress Notes (Signed)
[facility] at [facility]  [address] [place], [place] [address]  Phone:[phone]    [phone]    OUTPATIENT ORTHOPAEDIC PHYSICAL THERAPY  []                Initial Assessment     []                Daily Note      []                Progress Report       []                Recertification    [x]                Discharge  []                [name] Fall Risk Score:2 (5 or greater = High Risk)    NAME/AGE/GENDER: Ronald Fields is a 34 y.o. male     DATE: 12/21/2009  REFERRING PHYSICIAN: Dr. Marlana Latus  MD Orders: Eval and Treat  Return Physician Appointment: as needed  MEDICAL/REFERRING DIAGNOSIS: left calf pain  DATE OF ONSET: 10/19/09   PRIOR LEVEL OF FUNCTION: independent  PRECAUTIONS/ALLERGIES: none reported    SUBJECTIVE   Present Symptoms: Patient reports only mild calf soreness. 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   Pain Intensity 1: 1  Pain Location 1: Leg  Pain Orientation 1: Distal  History of Present Injury/Illness: Pt reports that he was playing in a rugby match 06/25/09 when he had sudden onset of pain in posterior left calf while running straight line. Pt reports re-injury in another match on 07/16/09. Pt also reports similar injury on the right side in 2009. Pt completed 2 sessions of PT in this facility. Pt reports he returned to rugby, and during straight line running at practice had sudden sharp return of pain in left calf. Pt states MRI indicated partial tear left gastroc.  Past Medical History: non-contributory  Current Medications: Ibuprofen   Social History/Home Situation: lives with family      Work/Activity History: Copywriter, advertising    OBJECTIVE     Observation/Orthostatic Postural Assessment:   Running analysis performed using [employer] software:    ?? Normal running mechanics at the hip and knee; however, he tends to be a forefoot runner even at slower speeds (L>R). This requires greater eccentric calf strength which he is lacking on the L side.  Foot analysis performed using the pedograph: normal foot type bilateral   Palpation: minimal tenderness over medial left gastroc/soleius      ROM:               LLE AROM  L Ankle Dorsiflexion: 20   L Ankle Plantar Flexion: 50   L Ankle Eversion: 15   L Ankle Inversion: 35            Strength:      LLE Strength  L Knee Flexion: 5  L Knee Extension: 5  L Ankle Dorsiflexion: 5  L Ankle Plantar Flexion: 5  L Ankle Eversion: 5  L Ankle Inversion: 5           Special Tests:   Neurological Screen:   Myotomes: intact  Dermatomes:intact  Functional Mobility: mild antalgic gait left LE      Balance: fair        TREATMENT   Education: discussed the results of his running and foot analysis, also talked about  a home program for progression of eccentric calf strength to include single leg eccentric contractions then single leg toe jumps then single toe jumps on an unstable surface and then toe cutting drills.   Manual Therapy (     0):   Therapeutic Exercise ( : education list above  Activity   Date  11/15/09 Date  11/24/09 Date  11/29/09 Date  12-09-09 Date  12/21/09    Parameters Parameters Parameters  Parameters   Recumbent bike Level 2 x 10' Level 2 x 10' Level 2 x 10' 5 min warm up Level 2 x 10'   Incline calf 5 x 10" 5 x 10" 5 x 10" 30 sec 5 x 10"   Wall sits 10 x 10" 10 x 10" 10 x 10"  10 x 10"   Step ups ant/lat/post X 10 each BLE X 10 each BLE X 10 each BLE  X 10 each BLE   theraband ankle   DF/PF/INV/Ever Blue x 10 -- Blue x 10  Blue x 10   Lg Fitter disc  PF/DF, INV/EV, circles X 10 each X 10 each --  --   lunge onto BOSU X 10 BLE X 10 BLE X 10 BLE  X 10 BLE   BOSU squats  X 10 X 10  X 10   SLS clocks  X 5 BLE X 5 BLE  X 5 BLE   Shuttle leg press 75# x 10 75# x 10 75# x 10  75# x 10    Shuttle calf raise 75# x 10 75# x 10 75# x 10  75# x 10   Shuttle leg thrusts 75# x 20 75# x 10 75# x 10  75# x 10   Single leg eccentric calf drops    10x, bilateral, 2 sets 10x, bilateral, 2 sets   Treadmill run    10 min (done for running analysis) --    HEP: As above; handouts given to patient for all exercises.  Therapeutic Modalities: (0')                                                                                                 ASSESSMENT   Mr. Tully demonstrates good understanding of HEP.  Pt demonstrates good ROM. Gait pattern is minimally antalgic. Pt appears to have reached level where we can discharge from active therapy and allow him to follow HEP. Pt vocalizes good understanding of importance in building ankle stability based on running analysis.  Response:  Patient???s response to today???s treatment session was tolerated well with no complications.  Upon completion of treatment, skin condition was clear.  Progression: very likely to progress.     Compliance with Program/Exercises: 9/10 appointments.   Other Comments:    **This section established at most recent assessment**  PROBLEM LIST (Impairments causing functional limitations):  1.  Pain in left calf  2.  Decreased ROM left ankle  3.  Decreased strength left ankle  4.  Antalgic gait  GOALS: (Goals have been discussed and agreed upon with patient.)  SHORT-TERM FUNCTIONAL GOALS: Time Frame: 4 weeks  1.  Decrease pain to 2-3 with walking  Goal Met 12/21/09  2.  Full PROM left ankle Goal Met 12/21/09  3.  Increase strength 1/2 grade Goal Met 12/21/09  4.  Mild antalgic gait Goal Met 12/21/09  DISCHARGE GOALS: Time Frame: 6 weeks  1.  Decrease pain to minimal with running Goal Met 12/21/09  2.  Full AROM left ankle Goal Met 12/21/09  3.  Increase strength 1 grade Goal Met 12/21/09  4.  Minimal antalgic gait Goal Met 12/21/09  REHABILITATION POTENTIAL FOR STATED GOALS: Good     PLAN OF CARE      TREATMENT PLAN EFFECTIVE DATES: 10/25/09 TO 12/21/09  9/10 appointments  PLAN: Discharge from active PT at this time. Patient is encouraged to continue HEP.  PT Patient Time In/Time Out  Time In: 0815  Time Out: 0900    Regarding Ronald Fields's therapy, I certify that the treatment plan above will be carried out by a therapist or under their direction.  Thank you for this referral,  Randon Goldsmith, PT

## 2012-08-27 MED ORDER — AZITHROMYCIN 500 MG TAB
500 mg | ORAL_TABLET | Freq: Every day | ORAL | Status: DC
Start: 2012-08-27 — End: 2012-12-17

## 2012-08-27 NOTE — Patient Instructions (Signed)
Chronic Sinusitis: After Your Visit  Your Care Instructions  Sinusitis is an infection of the lining of the sinus cavities in your head. It causes pain and pressure in your head and face.  Sinusitis can be short-term (acute) or long-term (chronic). Chronic sinusitis lasts 12 weeks or longer. It is often caused by a bacterial or fungal infection. Other things, such as allergies, may also be involved.  Chronic sinusitis may be hard to treat. It can lead to permanent changes in the mucous membranes that line the sinuses. It may make future sinus infections more likely.  The infection may take some time to treat. Antibiotics are usually used if the infection is caused by bacteria. You may also need to use a corticosteroid nasal spray. If the infection is not cured after you try two or more different antibiotics, you may want to talk with your doctor about surgery or allergy testing.  If the sinusitis is caused by a fungal infection, you may need to take antifungals or other medicines. You may also need surgery.  Follow-up care is a key part of your treatment and safety. Be sure to make and go to all appointments, and call your doctor if you are having problems. It's also a good idea to know your test results and keep a list of the medicines you take.  How can you care for yourself at home?  Medicines  ?? Be safe with medicines. Take your medicines exactly as prescribed. Call your doctor if you think you are having a problem with your medicine. You will get more details on the specific medicines your doctor prescribes.  ?? Take your antibiotics as directed. Do not stop taking them just because you feel better. You need to take the full course of antibiotics.  ?? Your doctor may recommend a corticosteroid nasal spray, wash, drops, or pills. Take this medicine exactly as prescribed.  At home  ?? Breathe warm, moist air. You can use a steamy shower, a hot bath, or a sink filled with hot water. Avoid cold, dry air. Using a  humidifier in your home may help. Follow the instructions for cleaning the machine.  ?? Use saline (saltwater) nasal washes every day. This helps keep your nasal passages open. It also can wash out mucus and bacteria.  ?? You can buy saline nose drops at a grocery store or drugstore.  ?? You can make your own at home. Add 1 teaspoon of salt and 1 teaspoon of baking soda to 2 cups of distilled water. If you make your own, fill a bulb syringe with the solution. Then insert the tip into your nostril and squeeze gently. Blow your nose.  ?? Put a warm, wet towel or a warm gel pack on your face 3 or 4 times a day. Leave it on 5 to 10 minutes each time.  ?? Do not smoke or breathe secondhand smoke. Smoking can make sinusitis worse. If you need help quitting, talk to your doctor about stop-smoking programs and medicines. These can increase your chances of quitting for good.  When should you call for help?  Call your doctor now or seek immediate medical care if:  ?? You have new or worse swelling or redness in face or around eyes.  Watch closely for changes in your health, and be sure to contact your doctor if:  ?? You have a new or higher fever.  ?? You have new or worse facial pain.  ?? The mucus from your nose becomes  thicker (like pus) or has new blood in it.  ?? You do not get better as expected.   Where can you learn more?   Go to [facility]  Enter [phone] in the search box to learn more about "Chronic Sinusitis: After Your Visit."   ?? 2006-2014 [employer]. Care instructions adapted under license by [employer] (which disclaims liability or warranty for this information). This care instruction is for use with your licensed healthcare professional. If you have questions about a medical condition or this instruction, always ask your healthcare professional. [employer], Incorporated disclaims any warranty or liability for your use of this information.  Content Version: 10.0.270728; Last Revised:  August 01, 2011              Allergies: After Your Visit  Your Care Instructions  Allergies occur when your body's defense system (immune system) overreacts to certain substances. The immune system treats a harmless substance as if it were a harmful germ or virus. Many things can cause this overreaction, including pollens, medicine, food, dust, animal dander, and mold.  Allergies can be mild or severe. Mild allergies can be managed with home treatment. But medicine may be needed to prevent problems.  Managing your allergies is an important part of staying healthy. Your doctor may suggest that you have allergy testing to help find out what is causing your allergies. When you know what things trigger your symptoms, you can avoid them. This can prevent allergy symptoms and other health problems.  For severe allergies that cause reactions that affect your whole body (anaphylactic reactions), your doctor may prescribe a shot of epinephrine (such as EpiPen) to carry with you in case you have a severe reaction. Learn how to give yourself the shot and keep it with you at all times. Make sure it is not expired.  Follow-up care is a key part of your treatment and safety. Be sure to make and go to all appointments, and call your doctor if you are having problems. It's also a good idea to know your test results and keep a list of the medicines you take.  How can you care for yourself at home?  ?? If you have been told by your doctor that dust or dust mites are causing your allergy, decrease the dust around your bed:  ?? Wash sheets, pillowcases, and other bedding in hot water every week.  ?? Use dust-proof covers for pillows, duvets, and mattresses. Avoid plastic covers because they tear easily and do not "breathe." Wash as instructed on the label.  ?? Do not use any blankets and pillows that you do not need.  ?? Use blankets that you can wash in your washing machine.  ?? Consider removing drapes and carpets, which attract and hold  dust, from your bedroom.  ?? If you are allergic to house dust and mites, do not use home humidifiers. Your doctor can suggest ways you can control dust and mites.  ?? Look for signs of cockroaches. Cockroaches cause allergic reactions. Use cockroach baits to get rid of them. Then, clean your home well. Cockroaches like areas where grocery bags, newspapers, empty bottles, or cardboard boxes are stored. Do not keep these inside your home, and keep trash and food containers sealed. Seal off any spots where cockroaches might enter your home.  ?? If you are allergic to mold, get rid of furniture, rugs, and drapes that smell musty. Check for mold in the bathroom.  ?? If you are allergic to outdoor  pollen or mold spores, use air-conditioning. Change or clean all filters every month. Keep windows closed.  ?? If you are allergic to pollen, stay inside when pollen counts are high. Use a vacuum cleaner with a HEPA filter or a double-thickness filter at least two times each week.  ?? Stay inside when air pollution is bad. Avoid paint fumes, perfumes, and other strong odors.  ?? Avoid conditions that make your allergies worse. Stay away from smoke. Do not smoke or let anyone else smoke in your house. Do not use fireplaces or wood-burning stoves.  ?? If you are allergic to your pets, change the air filter in your furnace every month. Use high-efficiency filters.  ?? If you are allergic to pet dander, keep pets outside or out of your bedroom. Old carpet and cloth furniture can hold a lot of animal dander. You may need to replace them.  When should you call for help?  Call 911 anytime you think you may need emergency care. For example, call if:  ?? You have severe trouble breathing.  ?? You passed out (lost consciousness).  Note: After calling 911, give yourself an epinephrine shot if you have one prescribed by your doctor.  Watch closely for changes in your health, and be sure to contact your doctor if:  ?? You need help controlling your  allergies.  ?? You have questions about allergy testing.  ?? You do not get better as expected.   Where can you learn more?   Go to [facility]  Enter W171 in the search box to learn more about "Allergies: After Your Visit."   ?? 2006-2014 [employer], [employer]. Care instructions adapted under license by [employer] (which disclaims liability or warranty for this information). This care instruction is for use with your licensed healthcare professional. If you have questions about a medical condition or this instruction, always ask your healthcare professional. [employer]wise, Inc[employer] disclaims any warranty or liability for your use of this information.  Content Version: 10.0.270728; Last Revised: February 25, 2012

## 2012-08-28 NOTE — Progress Notes (Signed)
CHIEF COMPLAINT:   Chief Complaint   Patient presents with   ??? Nasal Congestion     chest congestion   ??? Cough       HISTORY OF PRESENT ILLNESS: Ronald Fields is a 37 y.o. WHITE OR CAUCASIAN male with persistent cough and sx of URI.  He has been seen several times for URI including 2x in March and 1x in February.  He reports long term h/o repeated URI.  He saw an ENT many years ago but has never seen an asthma/allergist specialist.  He has excessive coughing and describes production of green mucous.  He has no otalgia, and mild sore throat.  He has had mild nausea that he attributes to excessive posterior oropharyngeal drainage.  He has felt achy and chilled but has not taken temperature.  He has a feeling of congestion and tightness in his chest and has increased nasal congestion and sinus tenderness.  He has no known h/o asthma and no past h/o smoking.  He has never used inhalers- no past PFTs.  A CXR in March was normal.        HISTORY:  No Known Allergies  Past Medical History   Diagnosis Date   ??? Dyspnea 07/31/2012   ??? Acute sinusitis 07/31/2012   ??? Gastroenteritis 07/31/2012   ??? Tinea pedis 07/31/2012   ??? Cellulitis 07/31/2012   ??? Situational anxiety 07/31/2012     History reviewed. No pertinent past surgical history.  Family History   Problem Relation Age of Onset   ??? Cancer Father    ??? Cancer Paternal Uncle    ??? Diabetes Maternal Grandmother    ??? Cancer Maternal Grandfather      History     Social History   ??? Marital Status: MARRIED     Spouse Name: N/A     Number of Children: N/A   ??? Years of Education: N/A     Occupational History   ??? Not on file.     Social History Main Topics   ??? Smoking status: Former Smoker -- 20 years   ??? Smokeless tobacco: Not on file   ??? Alcohol Use: Yes      Comment: social   ??? Drug Use: Not on file   ??? Sexually Active: Not on file     Other Topics Concern   ??? Not on file     Social History Narrative   ??? No narrative on file     Current Outpatient Prescriptions   Medication Sig  Dispense Refill   ??? cetirizine (ZYRTEC) 10 mg tablet Take  by mouth daily.       ??? azithromycin (ZITHROMAX TRI-PAK) 500 mg tablet Take 0.5 Tabs by mouth daily.  5 Tab  0   ??? calcium-cholecalciferol, d3, (CALCIUM 600 + D) 600-125 mg-unit tab Take  by mouth.       ??? pnv w/o calcium-iron fum-fa (M-VIT) 27-1 mg tab Take  by mouth.       ??? FAMCICLOVIR PO Take  by mouth.       ??? PHENYLEPHRINE/ACETAMINOPHEN/CP (NOREL AD PO) Take  by mouth.       ??? benzonatate (TESSALON PERLES) 100 mg capsule Take 100 mg by mouth three (3) times daily as needed for Cough.                 REVIEW OF SYSTEMS:   GENERAL/CONSTITUTIONAL: Positive for  - chills, fatigue, fever, Negative for: night sweats, sleep disturbance, weight gain, weight loss  HEAD, EYES, EARS, NOSE AND THROAT: Positive for -   nasal congestion, nasal discharge, sinus pain, sneezing, sore throat, Negative for: headaches, hearing change,vertigo, visual changes, , oral lesions  CARDIOVASCULAR: Negative for - chest pain, edema, irregular heartbeat, loss of consciousness, orthopnea, palpitations, paroxysmal nocturnal dyspnea, rapid heart rate,   RESPIRATORY:  Negative for -  hemoptysis, orthopnea, pleuritic pain, shortness of breath, tachypnea, Positive for intermittent wheezing and deep unresolved cough  GASTROINTESTINAL: Negative for - abdominal pain, appetite loss, blood in stools, change in bowel habits, change in stools, constipation, diarrhea heartburn, hematemesis, melena, swallowing difficulty/pain  Positive for mild nausea  GENITOURINARY: Negative for - Urinary frequency  MUSCULOSKELETAL: Negative for - gait disturbance, joint pain, joint stiffness, joint swelling, muscle pain and muscular weakness  NEUROLOGIC: Negative for -  confusion, dizziness, gait disturbance, headaches, impaired coordination/balance, memory loss, seizures, speech problems  PSYCHIATRIC: Negative for -, behavioral disorder, concentration difficulties, depression, irritability  ALLERGIC/IMMUNOLOGIC:   Positive for -  nasal congestion, postnasal drip, seasonal allergies      PHYSICAL EXAM:  Vital Signs - BP 110/74   Pulse 84   Temp(Src) 98.8 ??F (37.1 ??C)   Resp 16   Ht 6\' 3"  (1.905 m)   Wt 251 lb (113.853 kg)   BMI 31.37 kg/m2   Constitutional - alert, well appearing, and in no distress.  Eyes - pupils equal and reactive, extraocular eye movements intact, sclera injected, anicteric, conjunctiva pink  Ear, Nose, Mouth, Throat - external inspection of ears and nose is normal, TM's slightly erythematous, nonbulging, Posterior oropharynx erythematous and with increased clear drainage  Neck - supple, no significant adenopathy. No thyromegaly.  Respiratory - clear to auscultation, no wheezes, crackles, but +rhonchi noted in R-upper lobe; respirations nonlabored.  Cardiovascular - normal rate, regular rhythm, normal S1, S2, no murmurs, rubs, clicks or gallops, pedal pulses-+2B, no LE edema  Gastrointestinal - Abdomen soft, non tender, non distended, no masses, no organomegaly    Skin - no rash, warm and dry  Neurological -gait is normal . Speech is normal  Psychiatric - alert, oriented to person, place, and time.      LABS  No results found for this or any previous visit.        IMPRESSION    ICD-9-CM    1. Acute sinusitis 461.9 REFERRAL TO ALLERGY   2. Chronic recurrent sinusitis 473.9 REFERRAL TO ALLERGY   3. Chronic cough 786.2 REFERRAL TO ALLERGY       PLAN : See orders above.  Patient instructed to call with any questions concerns or progression in symptoms.    Rx abx and recommend mucinex and antihistamine OTC.  Will refer to allergist/asthma specialist for PFTs and evaluation of allergies that might be contributing to recurrent URI sx. Further recs to follow.           Ronald Milroy, NP          Dictated using voice recognition software. Proofread, but unrecognized voice recognition errors may exist.

## 2012-12-17 MED ORDER — AZITHROMYCIN 500 MG TAB
500 mg | ORAL_TABLET | ORAL | Status: DC
Start: 2012-12-17 — End: 2013-02-05

## 2012-12-17 MED ORDER — FAMCICLOVIR 500 MG TAB
500 mg | ORAL_TABLET | Freq: Three times a day (TID) | ORAL | Status: AC
Start: 2012-12-17 — End: 2012-12-24

## 2012-12-17 MED ORDER — CLORAZEPATE DIPOTASSIUM 3.75 MG TAB
3.75 mg | ORAL_TABLET | Freq: Two times a day (BID) | ORAL | Status: DC
Start: 2012-12-17 — End: 2013-02-05

## 2012-12-17 NOTE — Patient Instructions (Signed)
Instructed to continue medications and follow up as needed.  Instructions given verbally.

## 2012-12-17 NOTE — Progress Notes (Signed)
Ronald Fields  12-Feb-1976    HPI:  Ronald Fields is a 37 y.o. male here complaint of sore throat rhinorrhea cough ??4 days  Also for follow-up of HSV 1 and situational anxiety    Review of Systems: denies fever, chills, chest pain and shortness of breath.      History     Social History   ??? Marital Status: MARRIED     Spouse Name: N/A     Number of Children: N/A   ??? Years of Education: N/A     Occupational History   ??? Not on file.     Social History Main Topics   ??? Smoking status: Never Smoker    ??? Smokeless tobacco: Not on file   ??? Alcohol Use: No      Comment: social   ??? Drug Use: Not on file   ??? Sexually Active: Not on file     Other Topics Concern   ??? Not on file     Social History Narrative   ??? No narrative on file       Family History   Problem Relation Age of Onset   ??? Cancer Father    ??? Cancer Paternal Uncle    ??? Diabetes Maternal Grandmother    ??? Cancer Maternal Grandfather        Past Medical History   Diagnosis Date   ??? Dyspnea 07/31/2012   ??? Acute sinusitis 07/31/2012   ??? Gastroenteritis 07/31/2012   ??? Tinea pedis 07/31/2012   ??? Cellulitis 07/31/2012   ??? Situational anxiety 07/31/2012       Current Outpatient Prescriptions   Medication Sig Dispense Refill   ??? famciclovir (FAMVIR) 500 mg tablet Take 1 tablet by mouth three (3) times daily for 7 days.  30 tablet  3   ??? clorazepate (TRANXENE T-TAB) 3.75 mg tablet Take 1 tablet by mouth two (2) times a day.  30 tablet  1   ??? azithromycin (ZITHROMAX TRI-PAK) 500 mg tablet Take 2 tablets today then 1 tablet daily  For 4 days  5 tablet  0   ??? cetirizine (ZYRTEC) 10 mg tablet Take  by mouth daily.       ??? pnv w/o calcium-iron fum-fa (M-VIT) 27-1 mg tab Take  by mouth.       ??? calcium-cholecalciferol, d3, (CALCIUM 600 + D) 600-125 mg-unit tab Take  by mouth.       ??? PHENYLEPHRINE/ACETAMINOPHEN/CP (NOREL AD PO) Take  by mouth.       ??? benzonatate (TESSALON PERLES) 100 mg capsule Take 100 mg by mouth three (3) times daily as needed for Cough.           No Known  Allergies      Physical Exam:    Vitals: as on chart - BP 110/70   Pulse 72   Temp(Src) 97.5 ??F (36.4 ??C)   Resp 16   Ht 6\' 1"  (1.854 m)   Wt 249 lb (112.946 kg)   BMI 32.86 kg/m2  General: Well nourished, well developed affable white male, no apparent distress.  Posterior pharynx is erythematous without a daily or anterior cervical nodes  TM s negative  Lungs: Clear to auscultation bilaterally.  CV: Regular rate and rhythm with normal S1 and S2. No murmur appreciated.  Abdomen: Normal bowel sounds. Soft, non tender, no hepatomegaly. No guarding or rebound.  Extremities: No deformity. No clubbing, cyanosis or edema.    Labs: No results found for this or any  previous visit.    Assessment/Plan:       ICD-9-CM    1. Acute bronchitis 466.0 azithromycin (ZITHROMAX TRI-PAK) 500 mg tablet   2. 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 Situational anxiety 300.09 clorazepate (TRANXENE T-TAB) 3.75 mg tablet       Orders Placed This Encounter   ??? DISCONTD: clorazepate (TRANXENE T-TAB) 3.75 mg tablet     Sig: Take  by mouth.   ??? famciclovir (FAMVIR) 500 mg tablet     Sig: Take 1 tablet by mouth three (3) times daily for 7 days.     Dispense:  30 tablet     Refill:  3   ??? clorazepate (TRANXENE T-TAB) 3.75 mg tablet     Sig: Take 1 tablet by mouth two (2) times a day.     Dispense:  30 tablet     Refill:  1   ??? azithromycin (ZITHROMAX TRI-PAK) 500 mg tablet     Sig: Take 2 tablets today then 1 tablet daily  For 4 days     Dispense:  5 tablet     Refill:  0       Harley Alto, MD      Dictated using voice recognition software. Proofread, but unrecognized voice recognition errors may exist.

## 2012-12-17 NOTE — Telephone Encounter (Signed)
Pharmacy has question regarding the script written today. Please call to clarify.    azithromycin (ZITHROMAX TRI-PAK) 500 mg tablet   Sig: Take 2 tablets today then 1 tablet daily For 4 days   Dispense: 5 tablet

## 2012-12-17 NOTE — Telephone Encounter (Signed)
Pharmacy gave pt z-pak 500 1-1-1

## 2012-12-17 NOTE — Telephone Encounter (Signed)
pharmcy said the Z-pak   500mg   Was 1-1-65m the one u ordered was the tri-pak 2-1-1-1-1. I told her it was that one

## 2013-01-06 LAB — AMB POC KTY COMPLETE CBC
ABS. LYMPHS (POC): 2.5 10*3/uL
ABS. MONOS (POC): 0.3 10*3/uL
GR COUNT POC: 57.5
GR# POC: 3.8 10*3/uL
HCT (POC): 42.5 %
HGB (POC): 13.8 g/dL
LYMPHOCYTES (POC): 37.8 %
MCH (POC): 29.8 pg
MCHC (POC): 32.4 g/dL
MCV (POC): 91.9 fL
MO: 4.7
MPV (POC): 9.3 fL
PLATELET (POC): 175 10*3/uL
RBC (POC): 4.62 M/uL
RDW (POC): 12.9 %
WBC (POC): 6.6 10*3/uL

## 2013-01-06 LAB — AMB POC TSH: TSH POC: 3.64 u[IU]/mL

## 2013-01-06 NOTE — Addendum Note (Signed)
Addended by: Beverley Fiedler on: 01/06/2013 03:50 PM     Modules accepted: Orders

## 2013-01-06 NOTE — Patient Instructions (Signed)
Instructed to continue medications and follow up as needed.  Instructions given verbally.

## 2013-01-06 NOTE — Progress Notes (Signed)
CHIEF COMPLAINT    Chief Complaint   Patient presents with   ??? Physical         HISTORY OF PRESENT ILLNESS: Ronald Fields is a 37 y.o. male  For complete physical examination  Nonsmoker works full time  Married with children    HISTORY:  Past Medical History   Diagnosis Date   ??? Dyspnea 07/31/2012   ??? Acute sinusitis 07/31/2012   ??? Gastroenteritis 07/31/2012   ??? Tinea pedis 07/31/2012   ??? Cellulitis 07/31/2012   ??? Situational anxiety 07/31/2012     History reviewed. No pertinent past surgical history.  Family History   Problem Relation Age of Onset   ??? Cancer Father    ??? Cancer Paternal Uncle    ??? Diabetes Maternal Grandmother    ??? Cancer Maternal Grandfather      History     Social History   ??? Marital Status: MARRIED     Spouse Name: N/A     Number of Children: N/A   ??? Years of Education: N/A     Social History Main Topics   ??? Smoking status: Never Smoker    ??? Smokeless tobacco: Not on file   ??? Alcohol Use: No      Comment: social   ??? Drug Use: Not on file   ??? Sexually Active: Not on file     Other Topics Concern   ??? Not on file     Social History Narrative   ??? No narrative on file       MEDICATIONS:   Current Outpatient Prescriptions   Medication Sig Dispense Refill   ??? clorazepate (TRANXENE T-TAB) 3.75 mg tablet Take 1 tablet by mouth two (2) times a day.  30 tablet  1   ??? azithromycin (ZITHROMAX TRI-PAK) 500 mg tablet Take 2 tablets today then 1 tablet daily  For 4 days  5 tablet  0   ??? cetirizine (ZYRTEC) 10 mg tablet Take  by mouth daily.       ??? pnv w/o calcium-iron fum-fa (M-VIT) 27-1 mg tab Take  by mouth.       ??? calcium-cholecalciferol, d3, (CALCIUM 600 + D) 600-125 mg-unit tab Take  by mouth.       ??? PHENYLEPHRINE/ACETAMINOPHEN/CP (NOREL AD PO) Take  by mouth.       ??? benzonatate (TESSALON PERLES) 100 mg capsule Take 100 mg by mouth three (3) times daily as needed for Cough.             REVIEW OF SYSTEMS:     GENERAL: Negative weakness, negative fatigue, native malaise, negative chills, negative fever,  negative night sweats, negative allergies.  INTEGUMENTARY: Negative rash, negative jaundice.  HEMATOPOIETIC: Negative bleeding, negative lymph node enlargement, negative bruisability.  NEUROLOGIC: Negative headaches, negative syncope, negative seizures, negative weakness, negative tremor. No history of strokes, no history of other neurologic conditions.  EYES: Negative visual changes, negative diplopia, negative scotomata, negative impaired vision.  EARS: Negative tinnitus, negative vertigo, negative hearing impairment.  NOSE AND THROAT: Negative postnasal drip, negative sore throat.  CARDIOVASCULAR: Negative chest pain, negative dyspnea on exertion, negative palpations, negative edema. No history of heart attack, no history of arrhythmias, no history of hypertension.  RESPIRATORY: No history of shortness of breath, no history of asthma, no history of chronic obstructive pulmonary disease, no history of obstructive sleep apnea.  GASTROINTESTINAL: Negative dysphagia, negative nausea, negative vomiting, negative hematemesis, negative abdominal pain.  GENITOURINARY: Negative frequency, negative urgency, negative dysuria, negative  incontinence. No history of STDs.  MUSCULOSKELETAL: Negative myalgia, negative joint pain, negative stiffness, negative weakness, negative back pain.  PSYCHIATRIC: No history of psychiatric disorder.  ENDOCRINE: No history of alopecia, palpitations, sweats, dry skin, muscle weakness, fatigue.        PHYSICAL EXAM:     Vitals - BP 140/85   Pulse 72   Temp(Src) 97.6 ??F (36.4 ??C)   Resp 16   Ht 6\' 3"  (1.905 m)   Wt 250 lb (113.399 kg)   BMI 31.25 kg/m2   General appearance - alert, well appearing affable white male, and in no distress  Mental status - alert, oriented to person, place, and time  Eyes - pupils equal and reactive, extraocular eye movements intact  Nose - normal and patent, no erythema, discharge or polyps  Mouth - mucous membranes moist, pharynx normal without lesions  Neck -  supple, no significant adenopathy  Chest - clear to auscultation, no wheezes, rales or rhonchi, symmetric air entry  Heart - normal rate, regular rhythm, normal S1, S2, no murmur rubs, clicks or gallops  Abdomen - soft, nontender, nondistended, no masses or organomegaly  Genitourinary - normal male genitalia  Rectal prostate smooth symmetrical nontender 20 g no rectal masses  Back exam - full range of motion, no tenderness, palpable spasm or pain on motion  Neurological - alert, oriented, normal speech, no focal findings or movement disorder noted  Musculoskeletal - no joint tenderness, deformity or swelling  Extremities - peripheral pulses normal, no pedal edema, no clubbing or cyanosis  Skin - normal coloration and turgor, no rashes, no suspicious skin lesions noted    LABS No results found for this or any previous visit.    EKG:      XRAY:     IMPRESSION     ICD-9-CM   1. Periodic health assessment, general screening, adult V70.0         PLAN :   CPE lab routine follow-up        Ronald Alto, MD          Dictated using voice recognition software. Proofread, but unrecognized voice recognition errors may exist.

## 2013-01-07 LAB — METABOLIC PANEL, COMPREHENSIVE
A-G Ratio: 1.7 (ref 1.1–2.5)
ALT (SGPT): 20 IU/L (ref 0–44)
AST (SGOT): 30 IU/L (ref 0–40)
Albumin: 4.3 g/dL (ref 3.5–5.5)
Alk. phosphatase: 57 IU/L (ref 39–117)
BUN/Creatinine ratio: 19 (ref 8–19)
BUN: 21 mg/dL — ABNORMAL HIGH (ref 6–20)
Bilirubin, total: 0.4 mg/dL (ref 0.0–1.2)
CO2: 23 mmol/L (ref 18–29)
Calcium: 9.1 mg/dL (ref 8.7–10.2)
Chloride: 103 mmol/L (ref 97–108)
Creatinine: 1.12 mg/dL (ref 0.76–1.27)
GFR est AA: 96 mL/min/{1.73_m2} (ref >59)
GFR est non-AA: 83 mL/min/{1.73_m2} (ref >59)
GLOBULIN, TOTAL: 2.5 g/dL (ref 1.5–4.5)
Glucose: 88 mg/dL (ref 65–99)
Potassium: 4.2 mmol/L (ref 3.5–5.2)
Protein, total: 6.8 g/dL (ref 6.0–8.5)
Sodium: 142 mmol/L (ref 134–144)

## 2013-01-07 LAB — LIPID PANEL WITH LDL/HDL RATIO
Cholesterol, total: 241 mg/dL — ABNORMAL HIGH (ref 100–199)
HDL Cholesterol: 43 mg/dL (ref >39)
LDL, calculated: 148 mg/dL — ABNORMAL HIGH (ref 0–99)
LDL/HDL Ratio: 3.4 ratio units (ref 0.0–3.6)
Triglyceride: 248 mg/dL — ABNORMAL HIGH (ref 0–149)
VLDL, calculated: 50 mg/dL — ABNORMAL HIGH (ref 5–40)

## 2013-01-07 NOTE — Progress Notes (Signed)
Quick Note:    Notified via Answering machine of elevated LDL  Low fat diet increase cardio  ______

## 2013-01-08 NOTE — Progress Notes (Signed)
A user error has taken place: encounter opened in error, closed for administrative reasons.

## 2013-01-27 NOTE — Progress Notes (Signed)
Administered 0.5 mL of flu vaccine in the left deltoid-no complications.

## 2013-02-05 MED ORDER — AZITHROMYCIN 250 MG TAB
250 mg | ORAL_TABLET | ORAL | Status: DC
Start: 2013-02-05 — End: 2013-08-18

## 2013-02-05 NOTE — Progress Notes (Signed)
Luisangel Wainright  [birth year]    HPI:  Mr. Bowker is a 37 y.o. male here for .Evaluation of nasal congestion, facial pressure, greenish rhinorrhea present ??4 days    Review of Systems: denies fever, chills, chest pain and shortness of breath.      History     Social History   ??? Marital Status: MARRIED     Spouse Name: N/A     Number of Children: N/A   ??? Years of Education: N/A     Occupational History   ??? Not on file.     Social History Main Topics   ??? Smoking status: Never Smoker    ??? Smokeless tobacco: Never Used   ??? Alcohol Use: Yes      Comment: social   ??? Drug Use: No   ??? Sexually Active: Not on file     Other Topics Concern   ??? Not on file     Social History Narrative   ??? No narrative on file       Family History   Problem Relation Age of Onset   ??? Cancer Father    ??? Cancer Paternal Uncle    ??? Diabetes Maternal Grandmother    ??? Cancer Maternal Grandfather        Past Medical History   Diagnosis Date   ??? Dyspnea 07/31/2012   ??? Acute sinusitis 07/31/2012   ??? Gastroenteritis 07/31/2012   ??? Tinea pedis 07/31/2012   ??? Cellulitis 07/31/2012   ??? Situational anxiety 07/31/2012       Current Outpatient Prescriptions   Medication Sig Dispense Refill   ??? ascorbic acid (VITAMIN C) 500 mg tablet Take  by mouth.       ??? azithromycin (ZITHROMAX) 250 mg tablet Take two tablets today then one tablet daily  6 tablet  0       No Known Allergies      Physical Exam:    Vitals: as on chart - BP 124/76   Pulse 73   Temp(Src) 97.2 ??F (36.2 ??C) (Tympanic)   Resp 16   Ht 6\' 3"  (1.905 m)   Wt 248 lb (112.492 kg)   BMI 31 kg/m2  General: Well nourished, well developed Pleasant white male, no apparent distress.  Posterior pharynx is slightly erythematous without exudates.  TMs negative.  No anterior cervical nodes tender over maxillary sinuses  Lungs: Clear to auscultation bilaterally.  CV: Regular rate and rhythm with normal S1 and S2. No murmur appreciated.  Abdomen: Normal bowel sounds. Soft, non tender, no hepatomegaly. No guarding or  rebound.  Extremities: No deformity. No clubbing, cyanosis or edema.    Labs:   Results for orders placed in visit on 01/06/13   METABOLIC PANEL, COMPREHENSIVE       Result Value Range    Glucose 88  65 - 99 mg/dL    BUN 21 (*) 6 - 20 mg/dL    Creatinine 1.02  7.25 - 1.27 mg/dL    GFR est non-AA 83  >36 mL/min/1.73    GFR est AA 96  >59 mL/min/1.73    BUN/Creatinine ratio 19  8 - 19    Sodium 142  134 - 144 mmol/L    Potassium 4.2  3.5 - 5.2 mmol/L    Chloride 103  97 - 108 mmol/L    CO2 23  18 - 29 mmol/L    Calcium 9.1  8.7 - 10.2 mg/dL    Protein, total 6.8  6.0 - 8.5  g/dL    Albumin 4.3  3.5 - 5.5 g/dL    GLOBULIN, TOTAL 2.5  1.5 - 4.5 g/dL    A-G Ratio 1.7  1.1 - 2.5    Bilirubin, total 0.4  0.0 - 1.2 mg/dL    Alk. phosphatase 57  39 - 117 IU/L    AST 30  0 - 40 IU/L    ALT 20  0 - 44 IU/L   LIPID PANEL WITH LDL/HDL RATIO       Result Value Range    Cholesterol, total 241 (*) 100 - 199 mg/dL    Triglyceride 161 (*) 0 - 149 mg/dL    HDL Cholesterol 43  >39 mg/dL    VLDL, calculated 50 (*) 5 - 40 mg/dL    LDL, calculated 096 (*) 0 - 99 mg/dL    LDL/HDL Ratio 3.4  0.0 - 3.6 ratio units   AMB POC KTY COMPLETE CBC       Result Value Range    WBC (POC) 6.6      LYMPHOCYTES (POC) 37.8      MO 4.7      GR COUNT POC 57.5      ABS. LYMPHS (POC) 2.5      ABS. MONOS (POC) 0.3      GR# POC 3.8      RBC (POC) 4.62      HGB (POC) 13.8      HCT (POC) 42.5      MCV (POC) 91.9      MCH (POC) 29.8      MCHC (POC) 32.4      RDW (POC) 12.9      PLATELET (POC) 175      MPV (POC) 9.3     AMB POC TSH       Result Value Range    TSH POC 3.64         Assessment/Plan:       ICD-9-CM   1. Acute sinusitis 461.9       Orders Placed This Encounter   ??? ascorbic acid (VITAMIN C) 500 mg tablet     Sig: Take  by mouth.   ??? azithromycin (ZITHROMAX) 250 mg tablet     Sig: Take two tablets today then one tablet daily     Dispense:  6 tablet     Refill:  0     [facility] samples  Harley Alto, MD      Dictated using voice recognition software.  Proofread, but unrecognized voice recognition errors may exist.

## 2013-02-05 NOTE — Patient Instructions (Signed)
Instructed to continue medications and follow up as needed.  Instructions given verbally.

## 2013-03-09 MED ORDER — TRIMETHOPRIM-SULFAMETHOXAZOLE 160 MG-800 MG TAB
160-800 mg | ORAL_TABLET | Freq: Two times a day (BID) | ORAL | Status: DC
Start: 2013-03-09 — End: 2013-08-18

## 2013-03-09 NOTE — Patient Instructions (Signed)
Instructed to continue medications and follow up as needed.  Instructions given verbally.

## 2013-03-09 NOTE — Progress Notes (Signed)
Ronald Fields  Mar 20, 1976    HPI:  [name] is a 37 y.o. male here for .Evaluation of facial pressure, greenish rhinorrhea  Present ??7 days    Review of Systems: denies fever, chills, chest pain and shortness of breath.      History     Social History   ??? Marital Status: MARRIED     Spouse Name: N/A     Number of Children: N/A   ??? Years of Education: N/A     Occupational History   ??? Not on file.     Social History Main Topics   ??? Smoking status: Never Smoker    ??? Smokeless tobacco: Never Used   ??? Alcohol Use: Yes      Comment: social   ??? Drug Use: No   ??? Sexually Active: Not on file     Other Topics Concern   ??? Not on file     Social History Narrative   ??? No narrative on file       Family History   Problem Relation Age of Onset   ??? Cancer Father    ??? Cancer Paternal Uncle    ??? Diabetes Maternal Grandmother    ??? Cancer Maternal Grandfather        Past Medical History   Diagnosis Date   ??? Dyspnea 07/31/2012   ??? Acute sinusitis 07/31/2012   ??? Gastroenteritis 07/31/2012   ??? Tinea pedis 07/31/2012   ??? Cellulitis 07/31/2012   ??? Situational anxiety 07/31/2012       Current Outpatient Prescriptions   Medication Sig Dispense Refill   ??? trimethoprim-sulfamethoxazole (BACTRIM DS) 160-800 mg per tablet Take 1 tablet by mouth two (2) times a day.  20 tablet  0   ??? ascorbic acid (VITAMIN C) 500 mg tablet Take  by mouth.       ??? azithromycin (ZITHROMAX) 250 mg tablet Take two tablets today then one tablet daily  6 tablet  0       No Known Allergies      Physical Exam:    Vitals: as on chart - BP 90/70   Pulse 76   Temp(Src) 97.6 ??F (36.4 ??C)   Resp 16   Wt 251 lb (113.853 kg)   BMI 31.37 kg/m2  General: Well nourished, well d  Posterior pharynx clear without Exudates  Tender maxillary sinuses  Lungs: Clear to auscultation bilaterally.  CV: Regular rate and rhythm with normal S1 and S2. No murmur appreciated.  Abdomen: Normal bowel sounds. Soft, non tender, no hepatomegaly. No guarding or rebound.  Extremities: No deformity. No  clubbing, cyanosis or edema.    Labs:   Results for orders placed in visit on 01/06/13   METABOLIC PANEL, COMPREHENSIVE       Result Value Range    Glucose 88  65 - 99 mg/dL    BUN 21 (*) 6 - 20 mg/dL    Creatinine 1.61  0.96 - 1.27 mg/dL    GFR est non-AA 83  >04 mL/min/1.73    GFR est AA 96  >59 mL/min/1.73    BUN/Creatinine ratio 19  8 - 19    Sodium 142  134 - 144 mmol/L    Potassium 4.2  3.5 - 5.2 mmol/L    Chloride 103  97 - 108 mmol/L    CO2 23  18 - 29 mmol/L    Calcium 9.1  8.7 - 10.2 mg/dL    Protein, total 6.8  6.0 - 8.5  g/dL    Albumin 4.3  3.5 - 5.5 g/dL    GLOBULIN, TOTAL 2.5  1.5 - 4.5 g/dL    A-G Ratio 1.7  1.1 - 2.5    Bilirubin, total 0.4  0.0 - 1.2 mg/dL    Alk. phosphatase 57  39 - 117 IU/L    AST 30  0 - 40 IU/L    ALT 20  0 - 44 IU/L   LIPID PANEL WITH LDL/HDL RATIO       Result Value Range    Cholesterol, total 241 (*) 100 - 199 mg/dL    Triglyceride 191 (*) 0 - 149 mg/dL    HDL Cholesterol 43  >39 mg/dL    VLDL, calculated 50 (*) 5 - 40 mg/dL    LDL, calculated 478 (*) 0 - 99 mg/dL    LDL/HDL Ratio 3.4  0.0 - 3.6 ratio units   AMB POC KTY COMPLETE CBC       Result Value Range    WBC (POC) 6.6      LYMPHOCYTES (POC) 37.8      MO 4.7      GR COUNT POC 57.5      ABS. LYMPHS (POC) 2.5      ABS. MONOS (POC) 0.3      GR# POC 3.8      RBC (POC) 4.62      HGB (POC) 13.8      HCT (POC) 42.5      MCV (POC) 91.9      MCH (POC) 29.8      MCHC (POC) 32.4      RDW (POC) 12.9      PLATELET (POC) 175      MPV (POC) 9.3     AMB POC TSH       Result Value Range    TSH POC 3.64         Assessment/Plan:     1. Sinusitis, acute    - trimethoprim-sulfamethoxazole (BACTRIM DS) 160-800 mg per tablet; Take 1 tablet by mouth two (2) times a day.  Dispense: 20 tablet; Refill: 0      Orders Placed This Encounter   ??? trimethoprim-sulfamethoxazole (BACTRIM DS) 160-800 mg per tablet     Sig: Take 1 tablet by mouth two (2) times a day.     Dispense:  20 tablet     Refill:  0   [facility] #20     Harley Alto, MD       Dictated using voice recognition software. Proofread, but unrecognized voice recognition errors may exist.

## 2013-04-06 MED ORDER — AMOXICILLIN CLAVULANATE 875 MG-125 MG TAB
875-125 mg | ORAL_TABLET | Freq: Two times a day (BID) | ORAL | Status: DC
Start: 2013-04-06 — End: 2013-08-18

## 2013-04-06 NOTE — Patient Instructions (Signed)
Instructed to continue medications and follow up as needed.  Instructions given verbally.

## 2013-04-06 NOTE — Progress Notes (Signed)
Ronald Fields  1975/05/31    HPI:  Ronald Fields is a 37 y.o. male here for eval of rhimorrhea    Review of Systems: denies fever, chills, chest pain and shortness of breath.      History     Social History   ??? Marital Status: MARRIED     Spouse Name: N/A     Number of Children: N/A   ??? Years of Education: N/A     Occupational History   ??? Not on file.     Social History Main Topics   ??? Smoking status: Never Smoker    ??? Smokeless tobacco: Never Used   ??? Alcohol Use: Yes      Comment: social   ??? Drug Use: No   ??? Sexually Active: Not on file     Other Topics Concern   ??? Not on file     Social History Narrative   ??? No narrative on file       Family History   Problem Relation Age of Onset   ??? Cancer Father    ??? Cancer Paternal Uncle    ??? Diabetes Maternal Grandmother    ??? Cancer Maternal Grandfather        Past Medical History   Diagnosis Date   ??? Dyspnea 07/31/2012   ??? Acute sinusitis 07/31/2012   ??? Gastroenteritis 07/31/2012   ??? Tinea pedis 07/31/2012   ??? Cellulitis 07/31/2012   ??? Situational anxiety 07/31/2012       Current Outpatient Prescriptions   Medication Sig Dispense Refill   ??? amoxicillin-clavulanate (AUGMENTIN) 875-125 mg per tablet Take 1 tablet by mouth two (2) times a day.  20 tablet  0   ??? trimethoprim-sulfamethoxazole (BACTRIM DS) 160-800 mg per tablet Take 1 tablet by mouth two (2) times a day.  20 tablet  0   ??? ascorbic acid (VITAMIN C) 500 mg tablet Take  by mouth.       ??? azithromycin (ZITHROMAX) 250 mg tablet Take two tablets today then one tablet daily  6 tablet  0       No Known Allergies      Physical Exam:    Vitals: as on chart - BP 122/73   Pulse 90   Temp(Src) 98.4 ??F (36.9 ??C)   Resp 16   Wt 251 lb (113.853 kg)   BMI 31.37 kg/m2  General: Well nourished, well developed wm , no apparent distress.tendr over max sinuses  Lungs: Clear to auscultation bilaterally.  CV: Regular rate and rhythm with normal S1 and S2. No murmur appreciated.  Abdomen: Normal bowel sounds. Soft, non tender, no  hepatomegaly. No guarding or rebound.  Extremities: No deformity. No clubbing, cyanosis or edema.    Labs:   Results for orders placed in visit on 01/06/13   METABOLIC PANEL, COMPREHENSIVE       Result Value Range    Glucose 88  65 - 99 mg/dL    BUN 21 (*) 6 - 20 mg/dL    Creatinine 5.40  9.81 - 1.27 mg/dL    GFR est non-AA 83  >19 mL/min/1.73    GFR est AA 96  >59 mL/min/1.73    BUN/Creatinine ratio 19  8 - 19    Sodium 142  134 - 144 mmol/L    Potassium 4.2  3.5 - 5.2 mmol/L    Chloride 103  97 - 108 mmol/L    CO2 23  18 - 29 mmol/L    Calcium 9.1  8.7 - 10.2 mg/dL    Protein, total 6.8  6.0 - 8.5 g/dL    Albumin 4.3  3.5 - 5.5 g/dL    GLOBULIN, TOTAL 2.5  1.5 - 4.5 g/dL    A-G Ratio 1.7  1.1 - 2.5    Bilirubin, total 0.4  0.0 - 1.2 mg/dL    Alk. phosphatase 57  39 - 117 IU/L    AST 30  0 - 40 IU/L    ALT 20  0 - 44 IU/L   LIPID PANEL WITH LDL/HDL RATIO       Result Value Range    Cholesterol, total 241 (*) 100 - 199 mg/dL    Triglyceride 454 (*) 0 - 149 mg/dL    HDL Cholesterol 43  >39 mg/dL    VLDL, calculated 50 (*) 5 - 40 mg/dL    LDL, calculated 098 (*) 0 - 99 mg/dL    LDL/HDL Ratio 3.4  0.0 - 3.6 ratio units   AMB POC KTY COMPLETE CBC       Result Value Range    WBC (POC) 6.6      LYMPHOCYTES (POC) 37.8      MO 4.7      GR COUNT POC 57.5      ABS. LYMPHS (POC) 2.5      ABS. MONOS (POC) 0.3      GR# POC 3.8      RBC (POC) 4.62      HGB (POC) 13.8      HCT (POC) 42.5      MCV (POC) 91.9      MCH (POC) 29.8      MCHC (POC) 32.4      RDW (POC) 12.9      PLATELET (POC) 175      MPV (POC) 9.3     AMB POC TSH       Result Value Range    TSH POC 3.64         Assessment/Plan:     1. Acute sinusitis    - amoxicillin-clavulanate (AUGMENTIN) 875-125 mg per tablet; Take 1 tablet by mouth two (2) times a day.  Dispense: 20 tablet; Refill: 0      Orders Placed This Encounter   ??? amoxicillin-clavulanate (AUGMENTIN) 875-125 mg per tablet     Sig: Take 1 tablet by mouth two (2) times a day.     Dispense:  20 tablet      Refill:  0       Ronald Alto, MD      Dictated using voice recognition software. Proofread, but unrecognized voice recognition errors may exist.

## 2013-05-22 MED ORDER — CPM-PHENYLEPHRINE-ACETAMINOPHEN 4 MG-10 MG-325 MG TABLET
4-10-325 mg | Freq: Three times a day (TID) | ORAL | Status: DC
Start: 2013-05-22 — End: 2013-07-31

## 2013-05-22 NOTE — Telephone Encounter (Signed)
pt requesting Rx [facility] [address]

## 2013-05-22 NOTE — Telephone Encounter (Signed)
[facility]    [facility] [address] [place]

## 2013-07-31 MED ORDER — CPM-PHENYLEPHRINE-ACETAMINOPHEN 4 MG-10 MG-325 MG TABLET
4-10-325 mg | Freq: Three times a day (TID) | ORAL | Status: DC
Start: 2013-07-31 — End: 2013-11-20

## 2013-07-31 NOTE — Telephone Encounter (Signed)
Pt is going out of town   I told him Dr. Ace GinsLeeke is not here but we would see what we could do.    [facility]    [facility] [address] [place]    PT's [phone]

## 2013-07-31 NOTE — Telephone Encounter (Signed)
Sent in.

## 2013-08-18 LAB — AMB POC RAPID STREP A: Group A Strep Ag: NEGATIVE

## 2013-08-18 MED ORDER — TRIMETHOPRIM-SULFAMETHOXAZOLE 160 MG-800 MG TAB
160-800 mg | ORAL_TABLET | Freq: Two times a day (BID) | ORAL | Status: DC
Start: 2013-08-18 — End: 2013-11-20

## 2013-08-18 MED ORDER — CLORAZEPATE DIPOTASSIUM 3.75 MG TAB
3.75 mg | ORAL_TABLET | Freq: Two times a day (BID) | ORAL | Status: DC
Start: 2013-08-18 — End: 2013-11-20

## 2013-08-18 NOTE — Patient Instructions (Signed)
Instructed to continue medications and follow up as needed.  Instructions given verbally.

## 2013-08-18 NOTE — Addendum Note (Signed)
Addended by: Beverley FiedlerPRZEKOP, Hser Belanger R on: 08/18/2013 04:47 PM     Modules accepted: Orders

## 2013-08-18 NOTE — Progress Notes (Signed)
Ronald Fields  1976-02-13    HPI:  Ronald Fields is a 38 y.o. male Presents with facial pressure, cloudy rhinorrhea, scratchy throat ??4 days    Review of Systems: denies fever, chills, chest pain and shortness of breath.    Past Medical History   Diagnosis Date   ??? Dyspnea 07/31/2012   ??? Acute sinusitis 07/31/2012   ??? Gastroenteritis 07/31/2012   ??? Tinea pedis 07/31/2012   ??? Cellulitis 07/31/2012   ??? Situational anxiety 07/31/2012       History     Social History   ??? Marital Status: MARRIED     Spouse Name: N/A     Number of Children: N/A   ??? Years of Education: N/A     Occupational History   ??? Not on file.     Social History Main Topics   ??? Smoking status: Never Smoker    ??? Smokeless tobacco: Never Used   ??? Alcohol Use: Yes      Comment: social   ??? Drug Use: No   ??? Sexual Activity: Not on file     Other Topics Concern   ??? Not on file     Social History Narrative   ??? No narrative on file       Family History   Problem Relation Age of Onset   ??? Cancer Father    ??? Cancer Paternal Uncle    ??? Diabetes Maternal Grandmother    ??? Cancer Maternal Grandfather        Past Medical History   Diagnosis Date   ??? Dyspnea 07/31/2012   ??? Acute sinusitis 07/31/2012   ??? Gastroenteritis 07/31/2012   ??? Tinea pedis 07/31/2012   ??? Cellulitis 07/31/2012   ??? Situational anxiety 07/31/2012       Current Outpatient Prescriptions   Medication Sig Dispense Refill   ??? valACYclovir (VALTREX) 500 mg tablet Take  by mouth two (2) times a day.       ??? trimethoprim-sulfamethoxazole (BACTRIM DS) 160-800 mg per tablet Take 1 Tab by mouth two (2) times a day.  20 Tab  1   ??? clorazepate (TRANXENE T-TAB) 3.75 mg tablet Take 1 Tab by mouth two (2) times a day. Max Daily Amount: 7.5 mg.  36 Tab  2   ??? cpm-phenyleph-acetaminophen (NOREL AD) 4-10-325 mg tab Take 1 Each by mouth every eight (8) hours.  30 Each  0       No Known Allergies            Physical Exam:    Vitals: as on chart - BP 120/80   Pulse 80   Temp(Src) 97.2 ??F (36.2 ??C) (Tympanic)   Resp 16   Ht 6' 3"  (1.905 m)   Wt 248 lb (112.492 kg)   BMI 31.00 kg/m2   SpO2 97%  General: Well nourished, well developed Pleasant white male, no apparent distress.Posterior pharynx is slightly yearswithout exudates.  TMs negativeLungs: Clear to auscultation bilaterally.  CV: Regular rate and rhythm with normal S1 and S2. No murmur appreciated.  Abdomen: Normal bowel sounds. Soft, non tender, no hepatomegaly. No guarding or rebound.  Extremities: No deformity. No clubbing, cyanosis or edema.    Labs:   Results for orders placed in visit on 86/76/19   METABOLIC PANEL, COMPREHENSIVE       Result Value Ref Range    Glucose 88  65 - 99 mg/dL    BUN 21 (*) 6 - 20 mg/dL  Creatinine 1.12  0.76 - 1.27 mg/dL    GFR est non-AA 83  >59 mL/min/1.73    GFR est AA 96  >59 mL/min/1.73    BUN/Creatinine ratio 19  8 - 19    Sodium 142  134 - 144 mmol/L    Potassium 4.2  3.5 - 5.2 mmol/L    Chloride 103  97 - 108 mmol/L    CO2 23  18 - 29 mmol/L    Calcium 9.1  8.7 - 10.2 mg/dL    Protein, total 6.8  6.0 - 8.5 g/dL    Albumin 4.3  3.5 - 5.5 g/dL    GLOBULIN, TOTAL 2.5  1.5 - 4.5 g/dL    A-G Ratio 1.7  1.1 - 2.5    Bilirubin, total 0.4  0.0 - 1.2 mg/dL    Alk. phosphatase 57  39 - 117 IU/L    AST 30  0 - 40 IU/L    ALT 20  0 - 44 IU/L   LIPID PANEL WITH LDL/HDL RATIO       Result Value Ref Range    Cholesterol, total 241 (*) 100 - 199 mg/dL    Triglyceride 248 (*) 0 - 149 mg/dL    HDL Cholesterol 43  >39 mg/dL    VLDL, calculated 50 (*) 5 - 40 mg/dL    LDL, calculated 148 (*) 0 - 99 mg/dL    LDL/HDL Ratio 3.4  0.0 - 3.6 ratio units   AMB POC KTY COMPLETE CBC       Result Value Ref Range    WBC (POC) 6.6      LYMPHOCYTES (POC) 37.8      MO 4.7      GR COUNT POC 57.5      ABS. LYMPHS (POC) 2.5      ABS. MONOS (POC) 0.3      GR# POC 3.8      RBC (POC) 4.62      HGB (POC) 13.8      HCT (POC) 42.5      MCV (POC) 91.9      MCH (POC) 29.8      MCHC (POC) 32.4      RDW (POC) 12.9      PLATELET (POC) 175      MPV (POC) 9.3     AMB POC TSH       Result Value  Ref Range    TSH POC 3.64       .  Strep ID negative  Assessment/Plan:     1. Acute recurrent sinusitis, unspecified location  .Marland Kitchen  - trimethoprim-sulfamethoxazole (BACTRIM DS) 160-800 mg per tablet; Take 1 Tab by mouth two (2) times a day.  Dispense: 20 Tab; Refill: 1      Orders Placed This Encounter   ??? valACYclovir (VALTREX) 500 mg tablet     Sig: Take  by mouth two (2) times a day.   ??? trimethoprim-sulfamethoxazole (BACTRIM DS) 160-800 mg per tablet     Sig: Take 1 Tab by mouth two (2) times a day.     Dispense:  20 Tab     Refill:  1   ??? clorazepate (TRANXENE T-TAB) 3.75 mg tablet     Sig: Take 1 Tab by mouth two (2) times a day. Max Daily Amount: 7.5 mg.     Dispense:  36 Tab     Refill:  2       Nikki Dom, MD  Dictated using voice recognition software. Proofread, but unrecognized voice recognition errors may exist.

## 2013-11-19 NOTE — Other (Signed)
Attempted to return pt's call at only # listed----

## 2013-11-20 NOTE — Other (Signed)
Attempted to return pt's call-- left message for return call on only # listed

## 2013-11-20 NOTE — Other (Signed)
Patient verified name, DOB, and surgery as listed in [facility].    TYPE  CASE:1b            Orders:none at present  Labs per surgeon:  none   Labs per anesthesia protocol: none  EKG  :  none    Instructed patient to continue previous medications as prescribed prior to surgery and  to take the following medications the day of surgery according to anesthesia guidelines with a small sip of water : if needed valtrex       Continue all previous medications unless otherwise directed.     Instructed patient to hold  the following medications prior to surgery: multi vit    Patient instructed on the following and verbalized understanding:  Arrive at main   entrance, time of arrival to be called the day before by 1700.  Responsible adult must drive patient to and from hospital, stay during surgery and 24 hours postoperatively.  Npo after midnight including gum, mints and ice chips.  Use hibiclens in the shower the night before and the morning of surgery.  Leave all valuables at home. Instructed on no jewelry or body piercings on the dos.  Bring insurance card and ID.  No perfumes, oil, powder, colognes, makeup or  lotions on the skin.    Patient verbalized understanding of all instructions and provided all medical/health information to the best of their ability.

## 2013-11-24 ENCOUNTER — Inpatient Hospital Stay: Payer: BLUE CROSS/BLUE SHIELD

## 2013-11-24 MED ORDER — FENTANYL CITRATE (PF) 50 MCG/ML IJ SOLN
50 mcg/mL | INTRAMUSCULAR | Status: DC | PRN
Start: 2013-11-24 — End: 2013-11-24
  Administered 2013-11-24: 21:00:00 via INTRAVENOUS

## 2013-11-24 MED ORDER — OXYCODONE-ACETAMINOPHEN 5 MG-325 MG TAB
5-325 mg | ORAL_TABLET | ORAL | Status: DC | PRN
Start: 2013-11-24 — End: 2014-01-12

## 2013-11-24 MED ORDER — PROMETHAZINE 25 MG/ML INJECTION
25 mg/mL | INTRAMUSCULAR | Status: DC | PRN
Start: 2013-11-24 — End: 2013-11-24

## 2013-11-24 MED ORDER — HYDROMORPHONE (PF) 2 MG/ML IJ SOLN
2 mg/mL | INTRAMUSCULAR | Status: DC | PRN
Start: 2013-11-24 — End: 2013-11-24

## 2013-11-24 MED ORDER — MIDAZOLAM 1 MG/ML IJ SOLN
1 mg/mL | INTRAMUSCULAR | Status: AC
Start: 2013-11-24 — End: ?

## 2013-11-24 MED ORDER — LIDOCAINE (PF) 5 MG/ML (0.5 %) IJ SOLN
5 mg/mL (0. %) | INTRAMUSCULAR | Status: DC | PRN
Start: 2013-11-24 — End: 2013-11-24
  Administered 2013-11-24: 21:00:00 via INTRAVENOUS

## 2013-11-24 MED ORDER — BUPIVACAINE (PF) 0.5 % (5 MG/ML) IJ SOLN
0.5 % (5 mg/mL) | INTRAMUSCULAR | Status: DC | PRN
Start: 2013-11-24 — End: 2013-11-24
  Administered 2013-11-24: 22:00:00

## 2013-11-24 MED ORDER — CEFAZOLIN 2 GRAM/50 ML NS IVPB
Freq: Once | INTRAVENOUS | Status: AC
Start: 2013-11-24 — End: 2013-11-24
  Administered 2013-11-24: 21:00:00 via INTRAVENOUS

## 2013-11-24 MED ORDER — PROPOFOL 10 MG/ML IV EMUL
10 mg/mL | INTRAVENOUS | Status: DC | PRN
Start: 2013-11-24 — End: 2013-11-24
  Administered 2013-11-24 (×9): via INTRAVENOUS

## 2013-11-24 MED ORDER — FENTANYL CITRATE (PF) 50 MCG/ML IJ SOLN
50 mcg/mL | INTRAMUSCULAR | Status: AC
Start: 2013-11-24 — End: ?

## 2013-11-24 MED ORDER — MIDAZOLAM 1 MG/ML IJ SOLN
1 mg/mL | INTRAMUSCULAR | Status: DC | PRN
Start: 2013-11-24 — End: 2013-11-24
  Administered 2013-11-24: 21:00:00 via INTRAVENOUS

## 2013-11-24 MED ORDER — LACTATED RINGERS IV
INTRAVENOUS | Status: DC
Start: 2013-11-24 — End: 2013-11-24
  Administered 2013-11-24 (×2): via INTRAVENOUS

## 2013-11-24 MED ORDER — OXYCODONE-ACETAMINOPHEN 7.5 MG-325 MG TAB
Freq: Once | ORAL | Status: DC | PRN
Start: 2013-11-24 — End: 2013-11-24

## 2013-11-24 MED ORDER — SODIUM CHLORIDE 0.9 % IJ SYRG
INTRAMUSCULAR | Status: DC | PRN
Start: 2013-11-24 — End: 2013-11-24

## 2013-11-24 MED ORDER — BUPIVACAINE (PF) 0.5 % (5 MG/ML) IJ SOLN
0.5 % (5 mg/mL) | INTRAMUSCULAR | Status: AC
Start: 2013-11-24 — End: ?

## 2013-11-24 MED ORDER — MIDAZOLAM 1 MG/ML IJ SOLN
1 mg/mL | Freq: Once | INTRAMUSCULAR | Status: AC
Start: 2013-11-24 — End: 2013-11-24
  Administered 2013-11-24: 19:00:00 via INTRAVENOUS

## 2013-11-24 MED FILL — FENTANYL CITRATE (PF) 50 MCG/ML IJ SOLN: 50 mcg/mL | INTRAMUSCULAR | Qty: 2

## 2013-11-24 MED FILL — MIDAZOLAM 1 MG/ML IJ SOLN: 1 mg/mL | INTRAMUSCULAR | Qty: 2

## 2013-11-24 MED FILL — CEFAZOLIN 2 GRAM/50 ML NS IVPB: INTRAVENOUS | Qty: 50

## 2013-11-24 MED FILL — BUPIVACAINE (PF) 0.5 % (5 MG/ML) IJ SOLN: 0.5 % (5 mg/mL) | INTRAMUSCULAR | Qty: 10

## 2013-11-24 NOTE — Op Note (Signed)
Ronald Fields        11/24/2013    Indications: This is a 37 y.o. male who presents with a large left Ronald ganglion.  The patient was admitted for surgery as conservative measures have failed.    Pre-operative Diagnosis:  LEFT Ronald GANGLION CYST    Post-operative Diagnosis: LEFT Ronald GANGLION CYST    Procedure: LEFT Ronald GANGLIONECTOMY    Surgeon: Danae Chen, MD    Anesthesia:   IV Regional      Procedure/Operative Note:  After appropriate informed consent was obtained, the patient was taken to the operating suite and placed in a supine position on the operating room table with the left arm outstretched. A timeout was completed and confirmed the patient, surgeon, procedure and site.  All pressure points were carefully padded. The left upper extremity was prepped and draped in the usual sterile fashion. A  longitudinal incision was then made over the mass along the Ronald radial aspect of the wrist. Dissection was carried down through the subcutaneous tissue.  Small bleeders were electro-coagulated as needed.  The radial artery was identified and dissected free from the mass and preserved.  The mass was isolated with both sharp and spreading dissection with tenotomy scissors.  It was attached to the FCR tendon sheath and seemed to arise from the radio-carpal joint.  The cyst was detached and noted to be filled with a clear gelatinous fluid consistent with a ganglion and was not sent for histiologic exam.  No other abnormalities were noted.  The area around the surgical site was infiltrated with 0.5% Marcaine.  The sub cutaneous layer was closed with 4-0 absorbable sutures, Steri-strips were applied and then covered with Derma bond.  Sterile bulky dressings were applied and the pt then sent to the recovery room in good condition.                Signed By: Danae Chen, MD

## 2013-11-24 NOTE — Anesthesia Procedure Notes (Signed)
Peripheral Block    Start time: 11/24/2013 5:04 PM  End time: 11/24/2013 5:09 PM  Block Type: left bier block  Reason for block: procedure for pain, at surgeon's request and primary anesthetic  Staffing  Performed by: personally   Prep  Risks and benefits discussed with the patient and plans are to proceed  Site marked, Timeout performed, 17:04  Bier Block  Patient placed in flat position  Block Limb IV checked: Yes  Esmarch Exsanguination: Yes  Tourniquet: single, below elbow, left arm  Tourniquet Inflated: 11/24/2013 5:06 PM, 250 mmHg  Limb without Radial Pulse: Yes  Infused Agent: 50mL lidocaine 0.5%    Tourniquet Deflated: 11/24/2013 5:41 PM    Patient tolerated without any apparent complications

## 2013-11-24 NOTE — Other (Signed)
Patient resting comfortably in bed.  Wife at bedside.  Safety maintained.  Encouraged to call for assist.

## 2013-11-24 NOTE — Anesthesia Pre-Procedure Evaluation (Signed)
Anesthetic History   No history of anesthetic complications            Review of Systems / Medical History  Patient summary reviewed and pertinent labs reviewed    Pulmonary  Within defined limits                 Neuro/Psych   Within defined limits           Cardiovascular                  Exercise tolerance: >4 METS     GI/Hepatic/Renal  Within defined limits              Endo/Other  Within defined limits           Other Findings              Physical Exam    Airway  Mallampati: I  TM Distance: 4 - 6 cm  Neck ROM: normal range of motion   Mouth opening: Normal     Cardiovascular  Regular rate and rhythm,  S1 and S2 normal,  no murmur, click, rub, or gallop  Rhythm: regular  Rate: normal         Dental  No notable dental hx       Pulmonary  Breath sounds clear to auscultation               Abdominal  GI exam deferred       Other Findings            Anesthetic Plan    ASA: 1  Anesthesia type: regional - Bier block            Anesthetic plan and risks discussed with: Patient

## 2013-11-24 NOTE — Op Note (Signed)
Volar Ganglionectomy     Ezechiel Winner        11/24/2013    Indications: This is a 38 y.o. male who presents with a large left volar ganglion.  The patient was admitted for surgery as conservative measures have failed.    Pre-operative Diagnosis:  LEFT VOLAR GANGLION CYST    Post-operative Diagnosis: LEFT VOLAR GANGLION CYST    Procedure: LEFT VOLAR GANGLIONECTOMY    Surgeon: Ayla Dunigan ARNOLD BATSON JR, MD    Anesthesia:   IV Regional      Procedure/Operative Note:  After appropriate informed consent was obtained, the patient was taken to the operating suite and placed in a supine position on the operating room table with the left arm outstretched. A timeout was completed and confirmed the patient, surgeon, procedure and site.  All pressure points were carefully padded. The left upper extremity was prepped and draped in the usual sterile fashion. A  longitudinal incision was then made over the mass along the volar radial aspect of the wrist. Dissection was carried down through the subcutaneous tissue.  Small bleeders were electro-coagulated as needed.  The radial artery was identified and dissected free from the mass and preserved.  The mass was isolated with both sharp and spreading dissection with tenotomy scissors.  It was attached to the FCR tendon sheath and seemed to arise from the radio-carpal joint.  The cyst was detached and noted to be filled with a clear gelatinous fluid consistent with a ganglion and was not sent for histiologic exam.  No other abnormalities were noted.  The area around the surgical site was infiltrated with 0.5% Marcaine.  The sub cutaneous layer was closed with 4-0 absorbable sutures, Steri-strips were applied and then covered with Derma bond.  Sterile bulky dressings were applied and the pt then sent to the recovery room in good condition.                Signed By: Anella Nakata ARNOLD BATSON JR, MD

## 2013-11-24 NOTE — Anesthesia Procedure Notes (Addendum)
Peripheral Block    Start time: 11/24/2013 5:04 PM  End time: 11/24/2013 5:09 PM  Block Type: left bier block  Reason for block: procedure for pain, at surgeon's request and primary anesthetic  Staffing  Performed by: personally   Prep  Risks and benefits discussed with the patient and plans are to proceed  Site marked, Timeout performed, 17:04  Bier Block  Patient placed in flat position  Block Limb IV checked: Yes  Esmarch Exsanguination: Yes  Tourniquet: single, below elbow, left arm  Tourniquet Inflated: 11/24/2013 5:06 PM, 250 mmHg  Limb without Radial Pulse: Yes  Infused Agent: 50mL lidocaine 0.5%    Tourniquet Deflated: 11/24/2013 5:41 PM    Patient tolerated without any apparent complications

## 2013-11-24 NOTE — Other (Signed)
Hand off report to Hessie KnowsBrian McLaurin, Rn for continuation of care.

## 2013-11-24 NOTE — Anesthesia Post-Procedure Evaluation (Signed)
Post-Anesthesia Evaluation and Assessment    Patient: Ronald Fields MRN: [id number]  SSN: [id number]    Date of Birth: [birth year]  Age: 38 y.o.  Sex: male       Cardiovascular Function/Vital Signs  Visit Vitals   Item Reading   ??? BP 146/90 mmHg   ??? Pulse 66   ??? Temp 36.6 ??C (97.9 ??F)   ??? Resp 16   ??? Ht 6\' 3"  (1.905 m)   ??? Wt 111.131 kg (245 lb)   ??? BMI 30.62 kg/m2   ??? SpO2 94%       Patient is status post regional anesthesia for Procedure(s):  GANGLION CYST EXCISION LEFT WRIST .    Nausea/Vomiting: None    Postoperative hydration reviewed and adequate.    Pain:  Pain Scale 1: Numeric (0 - 10) (11/24/13 1748)  Pain Intensity 1: 0 (11/24/13 1748)   Managed    Neurological Status:   Neuro (WDL): Exceptions to WDL (11/24/13 1748)  Neuro  LUE Motor Response: Numbness (Bier block ) (11/24/13 1748)  LLE Motor Response: Purposeful (11/24/13 1748)  RUE Motor Response: Purposeful (11/24/13 1748)  RLE Motor Response: Purposeful (11/24/13 1748)   At baseline    Mental Status and Level of Consciousness: Alert and oriented     Pulmonary Status:   O2 Device: Room air (11/24/13 1748)   Adequate oxygenation and airway patent    Complications related to anesthesia: None    Post-anesthesia assessment completed. No concerns    Signed By: Thomes Lolling, MD     November 24, 2013

## 2013-11-25 MED FILL — LIDOCAINE (PF) 5 MG/ML (0.5 %) IJ SOLN: 5 mg/mL (0. %) | INTRAMUSCULAR | Qty: 50

## 2013-11-25 MED FILL — PROPOFOL 10 MG/ML IV EMUL: 10 mg/mL | INTRAVENOUS | Qty: 100

## 2014-01-12 LAB — AMB POC KTY COMPLETE CBC
ABS. LYMPHS (POC): 2.5 10*3/uL
ABS. MONOS (POC): 0.1 10*3/uL
GR# POC: 4.5 10*3/uL
GRANULOCYTES (POC): 62.7 %
HCT (POC): 44.5 %
HGB (POC): 14.9 g/dL
LYMPHOCYTES (POC): 35.3 %
MCH (POC): 29.9 pg
MCHC (POC): 33.5 g/dL
MCV (POC): 89.4 fL
MONOCYTES (POC): 2 %
MPV (POC): 9.4 fL
PLATELET (POC): 163 10*3/uL
RBC (POC): 4.98 M/uL — AB (ref 4.50–4.90)
RDW (POC): 12.6 % — AB (ref 13.2–16.0)
WBC (POC): 7.1 10*3/uL

## 2014-01-12 LAB — AMB POC LIPID PROFILE
Cholesterol (POC): 210 — AB (ref 100–199)
HDL Cholesterol (POC): 24 — AB (ref 39–?)
LDL Cholesterol (POC): 134 — AB (ref 0–99)
Non-HDL Goal (POC): 186 — AB (ref ?–160)
TChol/HDL Ratio (POC): 8.6 — AB (ref 0.0–3.6)
Triglycerides (POC): 259 — AB (ref 0–149)

## 2014-01-12 LAB — AMB POC TSH: TSH POC: 3.04 u[IU]/mL

## 2014-01-12 MED ORDER — VALACYCLOVIR 500 MG TAB
500 mg | ORAL_TABLET | ORAL | Status: DC | PRN
Start: 2014-01-12 — End: 2014-12-01

## 2014-01-12 MED ORDER — CPM-PHENYLEPHRINE-ACETAMINOPHEN 4 MG-10 MG-325 MG TABLET
4-10-325 mg | ORAL_CAPSULE | Freq: Two times a day (BID) | ORAL | Status: DC | PRN
Start: 2014-01-12 — End: 2014-06-03

## 2014-01-12 MED ORDER — CLORAZEPATE DIPOTASSIUM 3.75 MG TAB
3.75 mg | ORAL_TABLET | Freq: Two times a day (BID) | ORAL | Status: DC
Start: 2014-01-12 — End: 2014-06-03

## 2014-01-12 NOTE — Progress Notes (Signed)
CHIEF COMPLAINT    Chief Complaint   Patient presents with   ??? Complete Physical         HISTORY OF PRESENT ILLNESS: [name] is a 38 y.o. male  .For complete physical examination.  Doing well.  History of HSV AR and situational anxiety    HISTORY:  Past Medical History   Diagnosis Date   ??? Left wrist pain      Past Surgical History   Procedure Laterality Date   ??? Hx other surgical       liposuction under arms     Family History   Problem Relation Age of Onset   ??? Cancer Father      History     Social History   ??? Marital Status: MARRIED     Spouse Name: N/A     Number of Children: N/A   ??? Years of Education: N/A     Social History Main Topics   ??? Smoking status: Never Smoker    ??? Smokeless tobacco: Never Used   ??? Alcohol Use: Yes      Comment: occ   ??? Drug Use: No   ??? Sexual Activity: Not on file     Other Topics Concern   ??? Not on file     Social History Narrative       MEDICATIONS:   Current Outpatient Prescriptions   Medication Sig Dispense Refill   ??? valACYclovir (VALTREX) 500 mg tablet Take 1 Tab by mouth as needed. 30 Tab 3   ??? clorazepate (TRANXENE) 3.75 mg tablet Take 1 Tab by mouth two (2) times a day. Max Daily Amount: 7.5 mg. 36 Tab 2   ??? cpm-phenyleph-acetaminophen (NOREL AD) 4-10-325 mg tab Take 1 Cap by mouth two (2) times daily as needed. 30 Cap 3         REVIEW OF SYSTEMS:   GENERAL: Negative weakness, negative fatigue, native malaise, negative chills, negative fever, negative night sweats, negative allergies.  INTEGUMENTARY: Negative rash, negative jaundice.  HEMATOPOIETIC: Negative bleeding, negative lymph node enlargement, negative bruisability.  NEUROLOGIC: Negative headaches, negative syncope, negative seizures, negative weakness, negative tremor. No history of strokes, no history of other neurologic conditions.  EYES: Negative visual changes, negative diplopia, negative scotomata, negative impaired vision.  EARS: Negative tinnitus, negative vertigo, negative hearing impairment.   NOSE AND THROAT: Negative postnasal drip, negative sore throat.  CARDIOVASCULAR: Negative chest pain, negative dyspnea on exertion, negative palpations, negative edema. No history of heart attack, no history of arrhythmias, no history of hypertension.  RESPIRATORY: No history of shortness of breath, no history of asthma, no history of chronic obstructive pulmonary disease, no history of obstructive sleep apnea.  GASTROINTESTINAL: Negative dysphagia, negative nausea, negative vomiting, negative hematemesis, negative abdominal pain.  GENITOURINARY: Negative frequency, negative urgency, negative dysuria, negative incontinence. No history of STDs.  MUSCULOSKELETAL: Negative myalgia, negative joint pain, negative stiffness, negative weakness, negative back pain.  PSYCHIATRIC: No history of psychiatric disorder.  ENDOCRINE: No history of alopecia, palpitations, sweats, dry skin, muscle weakness, fatigue.        PHYSICAL EXAM:   Vitals - BP 110/78 mmHg   Pulse 76   Temp(Src) 97.4 ??F (36.3 ??C)   Resp 16   Ht 6\' 3"  (1.905 m)   Wt 242 lb (109.77 kg)   BMI 30.25 kg/m2   General appearance - alert, well appearingPleasant white male , and in no distress  Mental status - alert, oriented to person, place, and time  Eyes -  pupils equal and reactive, extraocular eye movements intact  Nose - normal and patent, no erythema, discharge or polyps  Mouth - mucous membranes moist, pharynx normal without lesions  Neck - supple, no significant adenopathy  Chest - clear to auscultation, no wheezes, rales or rhonchi, symmetric air entry  Heart - normal rate, regular rhythm, normal S1, S2, no murmur rubs, clicks or gallops  Abdomen - soft, nontender, nondistended, no masses or organomegaly  Genitourinary - deferred   Back exam - full range of motion, no tenderness, palpable spasm or pain on motion  Neurological - alert, oriented, normal speech, no focal findings or movement disorder noted   Musculoskeletal - no joint tenderness, deformity or swelling  Extremities - peripheral pulses normal, no pedal edema, no clubbing or cyanosis  Skin - normal coloration and turgor, no rashes, no suspicious skin lesions noted    LABS   Results for orders placed or performed in visit on 08/18/13   AMB POC RAPID STREP A   Result Value Ref Range    VALID INTERNAL CONTROL POC Yes     Group A Strep Ag Negative Negative       EKG:      XRAY:     IMPRESSION     ICD-9-CM ICD-10-CM    1. Periodic health assessment, general screening, adult V70.0 Z00.00    2. Situational anxiety 300.09 F41.8 clorazepate (TRANXENE) 3.75 mg tablet   3. 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  Lab        Harley Alto, MD          Dictated using voice recognition software. Proofread, but unrecognized voice recognition errors may exist.

## 2014-01-12 NOTE — Patient Instructions (Signed)
Instructed to continue medications and follow up as needed.  Instructions given verbally.

## 2014-01-12 NOTE — Addendum Note (Signed)
Addended by: Beverley FiedlerPRZEKOP, Lathen Seal R on: 01/12/2014 03:12 PM      Modules accepted: Orders

## 2014-01-13 LAB — METABOLIC PANEL, COMPREHENSIVE
A-G Ratio: 1.6 (ref 1.1–2.5)
ALT (SGPT): 14 IU/L (ref 0–44)
AST (SGOT): 30 IU/L (ref 0–40)
Albumin: 4.4 g/dL (ref 3.5–5.5)
Alk. phosphatase: 61 IU/L (ref 39–117)
BUN/Creatinine ratio: 17 (ref 8–19)
BUN: 19 mg/dL (ref 6–20)
Bilirubin, total: 0.4 mg/dL (ref 0.0–1.2)
CO2: 24 mmol/L (ref 18–29)
Calcium: 8.9 mg/dL (ref 8.7–10.2)
Chloride: 99 mmol/L (ref 97–108)
Creatinine: 1.14 mg/dL (ref 0.76–1.27)
GFR est AA: 94 mL/min/{1.73_m2} (ref >59)
GFR est non-AA: 81 mL/min/{1.73_m2} (ref >59)
GLOBULIN, TOTAL: 2.7 g/dL (ref 1.5–4.5)
Glucose: 87 mg/dL (ref 65–99)
Potassium: 4.5 mmol/L (ref 3.5–5.2)
Protein, total: 7.1 g/dL (ref 6.0–8.5)
Sodium: 140 mmol/L (ref 134–144)

## 2014-01-13 NOTE — Telephone Encounter (Signed)
Notified via answering machine of lab results.  HDL is low.  LDL slightly elevated  Suggest low-fat diet and more cardio

## 2014-04-01 ENCOUNTER — Ambulatory Visit
Admit: 2014-04-01 | Discharge: 2014-04-01 | Payer: BLUE CROSS/BLUE SHIELD | Attending: Family Medicine | Primary: Nurse Practitioner

## 2014-04-01 DIAGNOSIS — J019 Acute sinusitis, unspecified: Secondary | ICD-10-CM

## 2014-04-01 MED ORDER — AZITHROMYCIN 250 MG TAB
250 mg | ORAL_TABLET | ORAL | Status: DC
Start: 2014-04-01 — End: 2014-06-03

## 2014-04-01 NOTE — Patient Instructions (Signed)
Instructed to continue medications and follow up as needed.  Instructions given verbally.

## 2014-04-01 NOTE — Progress Notes (Signed)
Ronald Fields  04-Nov-1975    HPI:  Mr. Egnor is a 38 y.o. male here for Evaluation of nasal congestion, facial pressure ??7 days was cloudy rhinorrhea and nonproductive cough    Review of Systems: denies fever, chills, chest pain and shortness of breath.    Past Medical History   Diagnosis Date   ??? Left wrist pain        History     Social History   ??? Marital Status: MARRIED     Spouse Name: N/A     Number of Children: N/A   ??? Years of Education: N/A     Occupational History   ??? Not on file.     Social History Main Topics   ??? Smoking status: Never Smoker    ??? Smokeless tobacco: Never Used   ??? Alcohol Use: Yes      Comment: occ   ??? Drug Use: No   ??? Sexual Activity: Not on file     Other Topics Concern   ??? Not on file     Social History Narrative       Family History   Problem Relation Age of Onset   ??? Cancer Father        Past Medical History   Diagnosis Date   ??? Left wrist pain        Current Outpatient Prescriptions   Medication Sig Dispense Refill   ??? azithromycin (ZITHROMAX) 250 mg tablet Take two tablets today then one tablet daily 6 Tab 1   ??? valACYclovir (VALTREX) 500 mg tablet Take 1 Tab by mouth as needed. 30 Tab 3   ??? clorazepate (TRANXENE) 3.75 mg tablet Take 1 Tab by mouth two (2) times a day. Max Daily Amount: 7.5 mg. 36 Tab 2   ??? cpm-phenyleph-acetaminophen (NOREL AD) 4-10-325 mg tab Take 1 Cap by mouth two (2) times daily as needed. 30 Cap 3       No Known Allergies            Physical Exam:    Vitals: as on chart - BP 110/70 mmHg   Pulse 89   Temp(Src) 97.5 ??F (36.4 ??C)   Resp 16   Ht 6' 3"  (1.905 m)   Wt 244 lb (110.678 kg)   BMI 30.50 kg/m2   SpO2 96%  General: Well nourished, well developed  Pleasant white male, no apparent distress.Posterior pharynx slightly erythematous without exudates.  TMs negative.  Tender maxillary sinuses  Lungs: Clear to auscultation bilaterally.  CV: Regular rate and rhythm with normal S1 and S2. No murmur appreciated.   Abdomen: Normal bowel sounds. Soft, non tender, no hepatomegaly. No guarding or rebound.  Extremities: No deformity. No clubbing, cyanosis or edema.    Labs:   Results for orders placed or performed in visit on 99/37/16   METABOLIC PANEL, COMPREHENSIVE   Result Value Ref Range    Glucose 87 65 - 99 mg/dL    BUN 19 6 - 20 mg/dL    Creatinine 1.14 0.76 - 1.27 mg/dL    GFR est non-AA 81 >59 mL/min/1.73    GFR est AA 94 >59 mL/min/1.73    BUN/Creatinine ratio 17 8 - 19    Sodium 140 134 - 144 mmol/L    Potassium 4.5 3.5 - 5.2 mmol/L    Chloride 99 97 - 108 mmol/L    CO2 24 18 - 29 mmol/L    Calcium 8.9 8.7 - 10.2 mg/dL    Protein, total  7.1 6.0 - 8.5 g/dL    Albumin 4.4 3.5 - 5.5 g/dL    GLOBULIN, TOTAL 2.7 1.5 - 4.5 g/dL    A-G Ratio 1.6 1.1 - 2.5    Bilirubin, total 0.4 0.0 - 1.2 mg/dL    Alk. phosphatase 61 39 - 117 IU/L    AST 30 0 - 40 IU/L    ALT 14 0 - 44 IU/L   AMB POC LIPID PROFILE   Result Value Ref Range    Cholesterol (POC) 210 (A) 100 - 199    Triglycerides (POC) 259 (A) 0 - 149    HDL Cholesterol (POC) 24 (A) 39    LDL Cholesterol (POC) 134 (A) 0 - 99    Non-HDL Goal (POC) 186 (A) 160    TChol/HDL Ratio (POC) 8.6 (A) 0.0 - 3.6   AMB POC TSH   Result Value Ref Range    TSH POC 3.04 uIU/ml   AMB POC KTY COMPLETE CBC   Result Value Ref Range    WBC (POC) 7.1 K/uL    LYMPHOCYTES (POC) 35.3 %    MONOCYTES (POC) 2.0 %    GRANULOCYTES (POC) 62.7 %    ABS. LYMPHS (POC) 2.5 K/uL    ABS. MONOS (POC) 0.1 K/uL    GR# POC 4.5 K/uL    RBC (POC) 4.98 (A) 4.50 - 4.90 M/uL    HGB (POC) 14.9 g/dL    HCT (POC) 44.5 %    MCV (POC) 89.4 fL    MCH (POC) 29.9 pg    MCHC (POC) 33.5 g/dL    RDW (POC) 12.6 (A) 13.2 - 16.0 %    PLATELET (POC) 163 K/uL    MPV (POC) 9.4 fL       Assessment/Plan:     1. Acute sinusitis, recurrence not specified, unspecified location  - azithromycin (ZITHROMAX) 250 mg tablet; Take two tablets today then one tablet daily  Dispense: 6 Tab; Refill: 1      Orders Placed This Encounter    ??? azithromycin (ZITHROMAX) 250 mg tablet     Sig: Take two tablets today then one tablet daily     Dispense:  6 Tab     Refill:  1       Nikki Dom, MD      Dictated using voice recognition software. Proofread, but unrecognized voice recognition errors may exist.

## 2014-04-16 HISTORY — PX: BICEPS TENDON REPAIR: SHX566

## 2014-04-16 HISTORY — PX: GANGLION CYST EXCISION: SHX1691

## 2014-06-03 ENCOUNTER — Ambulatory Visit
Admit: 2014-06-03 | Discharge: 2014-06-03 | Payer: BLUE CROSS/BLUE SHIELD | Attending: Family Medicine | Primary: Nurse Practitioner

## 2014-06-03 DIAGNOSIS — J019 Acute sinusitis, unspecified: Secondary | ICD-10-CM

## 2014-06-03 MED ORDER — CLORAZEPATE DIPOTASSIUM 3.75 MG TAB
3.75 mg | ORAL_TABLET | Freq: Two times a day (BID) | ORAL | Status: DC
Start: 2014-06-03 — End: 2014-12-01

## 2014-06-03 MED ORDER — AZITHROMYCIN 250 MG TAB
250 mg | ORAL_TABLET | ORAL | Status: DC
Start: 2014-06-03 — End: 2014-12-01

## 2014-06-03 MED ORDER — CPM-PHENYLEPHRINE-ACETAMINOPHEN 4 MG-10 MG-325 MG TABLET
4-10-325 mg | ORAL_TABLET | Freq: Two times a day (BID) | ORAL | Status: DC
Start: 2014-06-03 — End: 2014-12-01

## 2014-06-03 NOTE — Progress Notes (Signed)
Ronald Fields  1975/12/29    HPI:  Ronald Fields is a 39 y.o. male here for .Evaluation of facial pressure, Cloudy rhinorrhea for the past 4 days    Review of Systems: denies fever, chills, chest pain and shortness of breath.    Past Medical History   Diagnosis Date   ??? Left wrist pain        History     Social History   ??? Marital Status: MARRIED     Spouse Name: N/A   ??? Number of Children: N/A   ??? Years of Education: N/A     Occupational History   ??? Not on file.     Social History Main Topics   ??? Smoking status: Never Smoker    ??? Smokeless tobacco: Never Used   ??? Alcohol Use: Yes      Comment: occ   ??? Drug Use: No   ??? Sexual Activity: Not on file     Other Topics Concern   ??? Not on file     Social History Narrative       Family History   Problem Relation Age of Onset   ??? Cancer Father        Past Medical History   Diagnosis Date   ??? Left wrist pain        Current Outpatient Prescriptions   Medication Sig Dispense Refill   ??? clorazepate (TRANXENE) 3.75 mg tablet Take 1 Tab by mouth two (2) times a day. Max Daily Amount: 7.5 mg. 36 Tab 2   ??? azithromycin (ZITHROMAX) 250 mg tablet Take two tablets today then one tablet daily 6 Tab 1   ??? cpm-phenyleph-acetaminophen (NOREL AD) 4-10-325 mg tab Take 1 Cap by mouth two (2) times a day. Indications: FLU-LIKE SYMPTOMS 24 Tab 2   ??? valACYclovir (VALTREX) 500 mg tablet Take 1 Tab by mouth as needed. 30 Tab 3       No Known Allergies            Physical Exam:    Vitals: as on chart - BP 126/78 mmHg   Pulse 80   Temp(Src) 97.1 ??F (36.2 ??C)   Resp 16   Ht 6' 3"  (1.905 m)   Wt 249 lb (112.946 kg)   BMI 31.12 kg/m2   SpO2 94%  General: Well nourished, well developed  Pleasant white maleno apparent distress.Posterior pharynx is clear.  Tender maxillary sinuses.  TMs negative  Lungs: Clear to auscultation bilaterally.  CV: Regular rate and rhythm with normal S1 and S2. No murmur appreciated.  Abdomen: Normal bowel sounds. Soft, non tender, no hepatomegaly. No guarding or rebound.   Extremities: No deformity. No clubbing, cyanosis or edema.    Labs:   Results for orders placed or performed in visit on 23/76/28   METABOLIC PANEL, COMPREHENSIVE   Result Value Ref Range    Glucose 87 65 - 99 mg/dL    BUN 19 6 - 20 mg/dL    Creatinine 1.14 0.76 - 1.27 mg/dL    GFR est non-AA 81 >59 mL/min/1.73    GFR est AA 94 >59 mL/min/1.73    BUN/Creatinine ratio 17 8 - 19    Sodium 140 134 - 144 mmol/L    Potassium 4.5 3.5 - 5.2 mmol/L    Chloride 99 97 - 108 mmol/L    CO2 24 18 - 29 mmol/L    Calcium 8.9 8.7 - 10.2 mg/dL    Protein, total 7.1 6.0 - 8.5 g/dL  Albumin 4.4 3.5 - 5.5 g/dL    GLOBULIN, TOTAL 2.7 1.5 - 4.5 g/dL    A-G Ratio 1.6 1.1 - 2.5    Bilirubin, total 0.4 0.0 - 1.2 mg/dL    Alk. phosphatase 61 39 - 117 IU/L    AST 30 0 - 40 IU/L    ALT 14 0 - 44 IU/L   AMB POC LIPID PROFILE   Result Value Ref Range    Cholesterol (POC) 210 (A) 100 - 199    Triglycerides (POC) 259 (A) 0 - 149    HDL Cholesterol (POC) 24 (A) 39    LDL Cholesterol (POC) 134 (A) 0 - 99    Non-HDL Goal (POC) 186 (A) 160    TChol/HDL Ratio (POC) 8.6 (A) 0.0 - 3.6   AMB POC TSH   Result Value Ref Range    TSH POC 3.04 uIU/ml   AMB POC KTY COMPLETE CBC   Result Value Ref Range    WBC (POC) 7.1 K/uL    LYMPHOCYTES (POC) 35.3 %    MONOCYTES (POC) 2.0 %    GRANULOCYTES (POC) 62.7 %    ABS. LYMPHS (POC) 2.5 K/uL    ABS. MONOS (POC) 0.1 K/uL    GR# POC 4.5 K/uL    RBC (POC) 4.98 (A) 4.50 - 4.90 M/uL    HGB (POC) 14.9 g/dL    HCT (POC) 44.5 %    MCV (POC) 89.4 fL    MCH (POC) 29.9 pg    MCHC (POC) 33.5 g/dL    RDW (POC) 12.6 (A) 13.2 - 16.0 %    PLATELET (POC) 163 K/uL    MPV (POC) 9.4 fL       Assessment/Plan:     1. Situational anxiety    - clorazepate (TRANXENE) 3.75 mg tablet; Take 1 Tab by mouth two (2) times a day. Max Daily Amount: 7.5 mg.  Dispense: 36 Tab; Refill: 2    2. Acute sinusitis, recurrence not specified, unspecified location    - azithromycin (ZITHROMAX) 250 mg tablet; Take two tablets today then one  tablet daily  Dispense: 6 Tab; Refill: 1  - cpm-phenyleph-acetaminophen (NOREL AD) 4-10-325 mg tab; Take 1 Cap by mouth two (2) times a day. Indications: FLU-LIKE SYMPTOMS  Dispense: 24 Tab; Refill: 2      Orders Placed This Encounter   ??? clorazepate (TRANXENE) 3.75 mg tablet     Sig: Take 1 Tab by mouth two (2) times a day. Max Daily Amount: 7.5 mg.     Dispense:  36 Tab     Refill:  2   ??? azithromycin (ZITHROMAX) 250 mg tablet     Sig: Take two tablets today then one tablet daily     Dispense:  6 Tab     Refill:  1   ??? cpm-phenyleph-acetaminophen (NOREL AD) 4-10-325 mg tab     Sig: Take 1 Cap by mouth two (2) times a day. Indications: FLU-LIKE SYMPTOMS     Dispense:  24 Tab     Refill:  2       Nikki Dom, MD      Dictated using voice recognition software. Proofread, but unrecognized voice recognition errors may exist.

## 2014-06-03 NOTE — Patient Instructions (Signed)
Instructed to continue medications and follow up as needed.  Instructions given verbally.

## 2014-12-01 ENCOUNTER — Ambulatory Visit
Admit: 2014-12-01 | Discharge: 2014-12-01 | Payer: BLUE CROSS/BLUE SHIELD | Attending: Family Medicine | Primary: Nurse Practitioner

## 2014-12-01 DIAGNOSIS — F418 Other specified anxiety disorders: Secondary | ICD-10-CM

## 2014-12-01 MED ORDER — VALACYCLOVIR 500 MG TAB
500 mg | ORAL_TABLET | ORAL | 3 refills | Status: DC | PRN
Start: 2014-12-01 — End: 2016-01-24

## 2014-12-01 MED ORDER — CLORAZEPATE DIPOTASSIUM 3.75 MG TAB
3.75 mg | ORAL_TABLET | Freq: Two times a day (BID) | ORAL | 2 refills | Status: DC
Start: 2014-12-01 — End: 2015-07-18

## 2014-12-01 NOTE — Progress Notes (Signed)
Ronald Fields  09-12-1975    HPI:  [name] is a 39 y.o. male here for father/u doing well. Take s tranxene prn anxiety    Review of Systems: denies fever, chills, chest pain and shortness of breath.    Past Medical History   Diagnosis Date   ??? Left wrist pain        Social History     Social History   ??? Marital status: MARRIED     Spouse name: N/A   ??? Number of children: N/A   ??? Years of education: N/A     Occupational History   ??? Not on file.     Social History Main Topics   ??? Smoking status: Never Smoker   ??? Smokeless tobacco: Never Used   ??? Alcohol use Yes      Comment: occ   ??? Drug use: No   ??? Sexual activity: Not on file     Other Topics Concern   ??? Not on file     Social History Narrative       Family History   Problem Relation Age of Onset   ??? Cancer Father        Past Medical History   Diagnosis Date   ??? Left wrist pain        Current Outpatient Prescriptions   Medication Sig Dispense Refill   ??? clorazepate (TRANXENE) 3.75 mg tablet Take 1 Tab by mouth two (2) times a day. Max Daily Amount: 7.5 mg. 36 Tab 2   ??? valACYclovir (VALTREX) 500 mg tablet Take 1 Tab by mouth as needed. Indications: SUPPRESSION OF RECURRENT HERPES SIMPLEX INFECTION 30 Tab 3       No Known Allergies            Physical Exam:    Vitals: as on chart -   Visit Vitals   ??? BP 124/70 (BP 1 Location: Right arm, BP Patient Position: Sitting)   ??? Pulse 88   ??? Temp 97.4 ??F (36.3 ??C)   ??? Resp 16   ??? Ht 6' 3"  (1.905 m)   ??? Wt 238 lb (108 kg)   ??? SpO2 96%   ??? BMI 29.75 kg/m2     General: Well nourished, well developed,wm no apparent distress.  Lungs: Clear to auscultation bilaterally.  CV: Regular rate and rhythm with normal S1 and S2. No murmur appreciated.  Abdomen: Normal bowel sounds. Soft, non tender, no hepatomegaly. No guarding or rebound.  Extremities: No deformity. No clubbing, cyanosis or edema.    Labs:   Results for orders placed or performed in visit on 88/41/66   METABOLIC PANEL, COMPREHENSIVE   Result Value Ref Range     Glucose 87 65 - 99 mg/dL    BUN 19 6 - 20 mg/dL    Creatinine 1.14 0.76 - 1.27 mg/dL    GFR est non-AA 81 >59 mL/min/1.73    GFR est AA 94 >59 mL/min/1.73    BUN/Creatinine ratio 17 8 - 19    Sodium 140 134 - 144 mmol/L    Potassium 4.5 3.5 - 5.2 mmol/L    Chloride 99 97 - 108 mmol/L    CO2 24 18 - 29 mmol/L    Calcium 8.9 8.7 - 10.2 mg/dL    Protein, total 7.1 6.0 - 8.5 g/dL    Albumin 4.4 3.5 - 5.5 g/dL    GLOBULIN, TOTAL 2.7 1.5 - 4.5 g/dL    A-G Ratio 1.6 1.1 - 2.5  Bilirubin, total 0.4 0.0 - 1.2 mg/dL    Alk. phosphatase 61 39 - 117 IU/L    AST 30 0 - 40 IU/L    ALT 14 0 - 44 IU/L   AMB POC LIPID PROFILE   Result Value Ref Range    Cholesterol (POC) 210 (A) 100 - 199    Triglycerides (POC) 259 (A) 0 - 149    HDL Cholesterol (POC) 24 (A) 39    LDL Cholesterol (POC) 134 (A) 0 - 99    Non-HDL Goal (POC) 186 (A) 160    TChol/HDL Ratio (POC) 8.6 (A) 0.0 - 3.6   AMB POC TSH   Result Value Ref Range    TSH POC 3.04 uIU/ml   AMB POC KTY COMPLETE CBC   Result Value Ref Range    WBC (POC) 7.1 K/uL    LYMPHOCYTES (POC) 35.3 %    MONOCYTES (POC) 2.0 %    GRANULOCYTES (POC) 62.7 %    ABS. LYMPHS (POC) 2.5 K/uL    ABS. MONOS (POC) 0.1 K/uL    GR# POC 4.5 K/uL    RBC (POC) 4.98 (A) 4.50 - 4.90 M/uL    HGB (POC) 14.9 g/dL    HCT (POC) 44.5 %    MCV (POC) 89.4 fL    MCH (POC) 29.9 pg    MCHC (POC) 33.5 g/dL    RDW (POC) 12.6 (A) 13.2 - 16.0 %    PLATELET (POC) 163 K/uL    MPV (POC) 9.4 fL       Assessment/Plan:     1. Situational anxiety  .  - clorazepate (TRANXENE) 3.75 mg tablet; Take 1 Tab by mouth two (2) times a day. Max Daily Amount: 7.5 mg.  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  - valACYclovir (VALTREX) 500 mg tablet; Take 1 Tab by mouth as needed. Indications: SUPPRESSION OF RECURRENT HERPES SIMPLEX INFECTION  Dispense: 30 Tab; Refill: 3      Orders Placed This Encounter   ??? clorazepate (TRANXENE) 3.75 mg tablet      Sig: Take 1 Tab by mouth two (2) times a day. Max Daily Amount: 7.5 mg.     Dispense:  36 Tab     Refill:  2   ??? valACYclovir (VALTREX) 500 mg tablet     Sig: Take 1 Tab by mouth as needed. Indications: SUPPRESSION OF RECURRENT HERPES SIMPLEX INFECTION     Dispense:  30 Tab     Refill:  3       Ronald Dom, MD      Dictated using voice recognition software. Proofread, but unrecognized voice recognition errors may exist.

## 2014-12-01 NOTE — Patient Instructions (Signed)
Instructed to continue medications and follow up as needed.  Instructions given verbally.

## 2014-12-06 ENCOUNTER — Encounter: Attending: Family Medicine | Primary: Nurse Practitioner

## 2015-01-18 NOTE — Telephone Encounter (Signed)
Please fill out biometric form. Call when ready.

## 2015-01-18 NOTE — Telephone Encounter (Signed)
Made patient a CPE with you. KJ

## 2015-02-03 NOTE — Other (Signed)
Patient verified name, DOB, and surgery as listed in [facility].    PHONE ASSESSMENT     TYPE  CASE:1B            Orders: received.  Labs per surgeon:  None ordered    Labs per anesthesia protocol: None needed   EKG  :  None needed     Pt medications obtained  PT OVER THE PHONE, med bottles NOT visualized. Requested pt to validate each medication, dose and frequency while here today.    Instructed patient to continue previous medications as prescribed prior to surgery and  to take the following medications the day of surgery according to anesthesia guidelines with a small sip of water : NORCO IF NEEDED        Continue all previous medications unless otherwise directed.     Instructed patient to hold  the following medications prior to surgery: IBUPROFEN, FISH OIL    Patient instructed on the following and verbalized understanding:  [facility] time of arrival to be called the day before by 1700.  Responsible adult must drive patient to and from hospital, stay during surgery and 24 hours postoperatively.  Npo after midnight including gum, mints and ice chips.  Use hibiclens in the shower the night before and the morning of surgery.  Leave all valuables at home. Instructed on no jewelry or body piercings on the dos.  Bring insurance card and ID.  No perfumes, oil, powder, colognes, makeup or  lotions on the skin.    Patient verbalized understanding of all instructions and provided all medical/health information to the best of their ability.

## 2015-02-04 ENCOUNTER — Inpatient Hospital Stay: Payer: BLUE CROSS/BLUE SHIELD

## 2015-02-04 ENCOUNTER — Inpatient Hospital Stay: Payer: BLUE CROSS/BLUE SHIELD | Primary: Nurse Practitioner

## 2015-02-04 ENCOUNTER — Ambulatory Visit: Admit: 2015-02-04 | Payer: BLUE CROSS/BLUE SHIELD | Primary: Nurse Practitioner

## 2015-02-04 MED ORDER — LIDOCAINE (PF) 20 MG/ML (2 %) IJ SOLN
20 mg/mL (2 %) | INTRAMUSCULAR | Status: DC | PRN
Start: 2015-02-04 — End: 2015-02-04
  Administered 2015-02-04: 16:00:00 via INTRAVENOUS

## 2015-02-04 MED ORDER — MIDAZOLAM 1 MG/ML IJ SOLN
1 mg/mL | INTRAMUSCULAR | Status: DC | PRN
Start: 2015-02-04 — End: 2015-02-04
  Administered 2015-02-04: 16:00:00 via INTRAVENOUS

## 2015-02-04 MED ORDER — LIDOCAINE (PF) 20 MG/ML (2 %) IV SYRINGE
100 mg/5 mL (2 %) | INTRAVENOUS | Status: AC
Start: 2015-02-04 — End: ?

## 2015-02-04 MED ORDER — ONDANSETRON (PF) 4 MG/2 ML INJECTION
4 mg/2 mL | Freq: Once | INTRAMUSCULAR | Status: DC
Start: 2015-02-04 — End: 2015-02-04

## 2015-02-04 MED ORDER — PROPOFOL 10 MG/ML IV EMUL
10 mg/mL | INTRAVENOUS | Status: AC
Start: 2015-02-04 — End: ?

## 2015-02-04 MED ORDER — OXYCODONE 5 MG TAB
5 mg | Freq: Once | ORAL | Status: DC | PRN
Start: 2015-02-04 — End: 2015-02-04

## 2015-02-04 MED ORDER — FENTANYL CITRATE (PF) 50 MCG/ML IJ SOLN
50 mcg/mL | INTRAMUSCULAR | Status: AC
Start: 2015-02-04 — End: ?

## 2015-02-04 MED ORDER — DIPHENHYDRAMINE HCL 50 MG/ML IJ SOLN
50 mg/mL | Freq: Once | INTRAMUSCULAR | Status: DC
Start: 2015-02-04 — End: 2015-02-04

## 2015-02-04 MED ORDER — MIDAZOLAM 1 MG/ML IJ SOLN
1 mg/mL | INTRAMUSCULAR | Status: AC
Start: 2015-02-04 — End: ?

## 2015-02-04 MED ORDER — SODIUM CHLORIDE 0.9 % IJ SYRG
Freq: Three times a day (TID) | INTRAMUSCULAR | Status: DC
Start: 2015-02-04 — End: 2015-02-04

## 2015-02-04 MED ORDER — ROPIVACAINE (PF) 5 MG/ML (0.5 %) INJECTION
5 mg/mL (0. %) | INTRAMUSCULAR | Status: DC | PRN
Start: 2015-02-04 — End: 2015-02-04
  Administered 2015-02-04: 15:00:00 via PERINEURAL

## 2015-02-04 MED ORDER — LACTATED RINGERS IV
INTRAVENOUS | Status: DC
Start: 2015-02-04 — End: 2015-02-04
  Administered 2015-02-04: 15:00:00 via INTRAVENOUS

## 2015-02-04 MED ORDER — ACETAMINOPHEN 500 MG TAB
500 mg | Freq: Once | ORAL | Status: DC
Start: 2015-02-04 — End: 2015-02-04

## 2015-02-04 MED ORDER — LACTATED RINGERS IV
INTRAVENOUS | Status: DC
Start: 2015-02-04 — End: 2015-02-04

## 2015-02-04 MED ORDER — FENTANYL CITRATE (PF) 50 MCG/ML IJ SOLN
50 mcg/mL | INTRAMUSCULAR | Status: DC | PRN
Start: 2015-02-04 — End: 2015-02-04
  Administered 2015-02-04: 16:00:00 via INTRAVENOUS

## 2015-02-04 MED ORDER — MIDAZOLAM 1 MG/ML IJ SOLN
1 mg/mL | Freq: Once | INTRAMUSCULAR | Status: DC | PRN
Start: 2015-02-04 — End: 2015-02-04
  Administered 2015-02-04: 15:00:00 via INTRAVENOUS

## 2015-02-04 MED ORDER — PROPOFOL 10 MG/ML IV EMUL
10 mg/mL | INTRAVENOUS | Status: DC | PRN
Start: 2015-02-04 — End: 2015-02-04
  Administered 2015-02-04: 16:00:00 via INTRAVENOUS

## 2015-02-04 MED ORDER — FENTANYL CITRATE (PF) 50 MCG/ML IJ SOLN
50 mcg/mL | INTRAMUSCULAR | Status: DC | PRN
Start: 2015-02-04 — End: 2015-02-04
  Administered 2015-02-04: 15:00:00 via INTRAVENOUS

## 2015-02-04 MED ORDER — HYDROMORPHONE (PF) 2 MG/ML IJ SOLN
2 mg/mL | INTRAMUSCULAR | Status: DC | PRN
Start: 2015-02-04 — End: 2015-02-04

## 2015-02-04 MED ORDER — SODIUM CHLORIDE 0.9 % IJ SYRG
INTRAMUSCULAR | Status: DC | PRN
Start: 2015-02-04 — End: 2015-02-04

## 2015-02-04 MED ORDER — LACTATED RINGERS BOLUS IV
Freq: Once | INTRAVENOUS | Status: DC
Start: 2015-02-04 — End: 2015-02-04

## 2015-02-04 MED ORDER — FAMOTIDINE (PF) 20 MG/2 ML IV
20 mg/2 mL | Freq: Once | INTRAVENOUS | Status: DC
Start: 2015-02-04 — End: 2015-02-04

## 2015-02-04 MED ORDER — LIDOCAINE HCL 1 % (10 MG/ML) IJ SOLN
10 mg/mL (1 %) | INTRAMUSCULAR | Status: DC | PRN
Start: 2015-02-04 — End: 2015-02-04

## 2015-02-04 MED ORDER — CEFAZOLIN 2 GRAM/50 ML NS IVPB
Freq: Once | INTRAVENOUS | Status: AC
Start: 2015-02-04 — End: 2015-02-04
  Administered 2015-02-04: 16:00:00 via INTRAVENOUS

## 2015-02-04 MED ORDER — FAMOTIDINE 20 MG TAB
20 mg | Freq: Once | ORAL | Status: AC
Start: 2015-02-04 — End: 2015-02-04
  Administered 2015-02-04: 15:00:00 via ORAL

## 2015-02-04 MED ORDER — NALOXONE 0.4 MG/ML INJECTION
0.4 mg/mL | INTRAMUSCULAR | Status: DC | PRN
Start: 2015-02-04 — End: 2015-02-04

## 2015-02-04 MED ORDER — PROPOFOL 10 MG/ML IV EMUL
10 mg/mL | INTRAVENOUS | Status: DC | PRN
Start: 2015-02-04 — End: 2015-02-04
  Administered 2015-02-04 (×2): via INTRAVENOUS

## 2015-02-04 MED FILL — CEFAZOLIN 2 GRAM/50 ML NS IVPB: INTRAVENOUS | Qty: 50

## 2015-02-04 MED FILL — PROPOFOL 10 MG/ML IV EMUL: 10 mg/mL | INTRAVENOUS | Qty: 40

## 2015-02-04 MED FILL — FENTANYL CITRATE (PF) 50 MCG/ML IJ SOLN: 50 mcg/mL | INTRAMUSCULAR | Qty: 2

## 2015-02-04 MED FILL — PROPOFOL 10 MG/ML IV EMUL: 10 mg/mL | INTRAVENOUS | Qty: 20

## 2015-02-04 MED FILL — LIDOCAINE (PF) 20 MG/ML (2 %) IV SYRINGE: 100 mg/5 mL (2 %) | INTRAVENOUS | Qty: 5

## 2015-02-04 MED FILL — MIDAZOLAM 1 MG/ML IJ SOLN: 1 mg/mL | INTRAMUSCULAR | Qty: 2

## 2015-02-04 MED FILL — LIDOCAINE (PF) 20 MG/ML (2 %) IJ SOLN: 20 mg/mL (2 %) | INTRAMUSCULAR | Qty: 80

## 2015-02-04 MED FILL — PROPOFOL 10 MG/ML IV EMUL: 10 mg/mL | INTRAVENOUS | Qty: 590.56

## 2015-02-04 MED FILL — NAROPIN (PF) 5 MG/ML (0.5 %) INJECTION SOLUTION: 5 mg/mL (0. %) | INTRAMUSCULAR | Qty: 30

## 2015-02-04 MED FILL — FAMOTIDINE 20 MG TAB: 20 mg | ORAL | Qty: 1

## 2015-02-04 NOTE — Op Note (Signed)
Dictated.

## 2015-02-04 NOTE — Anesthesia Procedure Notes (Signed)
Peripheral Block    Start time: 02/04/2015 10:54 AM  End time: 02/04/2015 10:58 AM  Performed by: Hero Mccathern B  Authorized by: Coltyn Hanning B       Pre-procedure:   Indications: at surgeon's request, post-op pain management and procedure for pain    Preanesthetic Checklist: patient identified, risks and benefits discussed, site marked, timeout performed, anesthesia consent given and patient being monitored    Timeout Time: 10:54          Block Type:   Block Type:  Supraclavicular  Laterality:  Left  Monitoring:  Standard ASA monitoring, responsive to questions, continuous pulse ox, oxygen, frequent vital sign checks and heart rate  Injection Technique:  Single shot  Procedures: ultrasound guided    Patient Position: seated  Prep: chlorhexidine    Location:  Supraclavicular  Needle Type:  Stimuplex  Needle Gauge:  22 G  Needle Localization:  Ultrasound guidance  Medication Injected:  0.5%  ropivacaine  Volume (mL):  30    Assessment:  Number of attempts:  1  Injection Assessment:  Incremental injection every 5 mL, negative aspiration for CSF, no paresthesia, ultrasound image on chart, no intravascular symptoms, negative aspiration for blood and local visualized surrounding nerve on ultrasound  Patient tolerance:  Patient tolerated the procedure well with no immediate complications

## 2015-02-04 NOTE — Op Note (Signed)
[facility]   OPERATIVE REPORT       Name:  [name], Ronald   MR#:  [id number]   DOB:  Dec 21, 1975   Account #:  [id number]   Date of Adm:  02/04/2015       DATE OF SURGERY: 02/04/2015     PREOPERATIVE DIAGNOSIS: Left distal biceps tear/rupture.    POSTOPERATIVE DIAGNOSIS: Left distal biceps tear/rupture.    OPERATION PERFORMED: Repair of left distal biceps rupture.    SURGEON: Germaine Pomfrethomas E. Delayza Lungren, MD.    ASSISTANT: Olen Cordialarl A Howell, PA.    ANESTHESIA: General.    FLUIDS: Crystalloid.    ESTIMATED BLOOD LOSS: Minimal.    DESCRIPTION OF PROCEDURE: After informed consent, the patient   was brought to the operating room and placed on the table in   supine position. General endotracheal anesthesia was   administered without difficulty. Tourniquet was applied to left   upper extremity. Left elbow and arm prepped and draped in   sterile fashion. The extremity was exsanguinated,  tourniquet   was inflated.     An anterior incision made over the antecubital fossa and the   flexor crease, carried down to subcutaneous tissue. The biceps   tendon was completely ruptured. The edge of the tendon was   debrided back to healthy appearing tissue. A Bunnell weave type   stitch was placed, fixing the Biomet Ziploc anchor system to the   distal biceps. The radial tuberosity was exposed at the original   insertion. This was verified under fluoroscopy. An interosseous   tunnel was appropriately reamed and the Ziploc anchor was placed   out the distal cortex and the biceps was anatomically repaired   into the interosseous tunnel. Fluoroscopy was used to verify   acceptable position of the implant. Anatomic repair of the   distal biceps was performed without difficulty. Wounds were   irrigated, closed in layers. Sterile dressings and a posterior   and medial lateral splints were applied.     The patient was extubated and taken to recovery room in stable   condition.    Gayla Dossarl Howell, PA, was utilized during the  procedure. He was   necessary for opening, closing, splint application and assisting   during the major portions of the procedure, including repair of   the distal biceps with appropriate positioning of the arm in   supination at all times. His presence decreased the operative   time and potential complication risk.        Germaine PomfretHOMAS E. Makaylah Oddo, MD      TEB / [place]   D:  02/04/2015   12:28   T:  02/04/2015   12:47   Job #:  [id number]

## 2015-02-04 NOTE — Op Note (Signed)
[facility]   OPERATIVE REPORT       Name:  [name], Ronald   MR#:  [id number]   DOB:  November 09, 1975   Account #:  [id number]   Date of Adm:  02/04/2015       DATE OF SURGERY: 02/04/2015     PREOPERATIVE DIAGNOSIS: Left distal biceps tear/rupture.    POSTOPERATIVE DIAGNOSIS: Left distal biceps tear/rupture.    OPERATION PERFORMED: Repair of left distal biceps rupture.    SURGEON: Germaine Pomfrethomas E. Severa Jeremiah, MD.    ASSISTANT: Olen Cordialarl A Howell, PA.    ANESTHESIA: General.    FLUIDS: Crystalloid.    ESTIMATED BLOOD LOSS: Minimal.    DESCRIPTION OF PROCEDURE: After informed consent, the patient   was brought to the operating room and placed on the table in   supine position. General endotracheal anesthesia was   administered without difficulty. Tourniquet was applied to left   upper extremity. Left elbow and arm prepped and draped in   sterile fashion. The extremity was exsanguinated,  tourniquet   was inflated.     An anterior incision made over the antecubital fossa and the   flexor crease, carried down to subcutaneous tissue. The biceps   tendon was completely ruptured. The edge of the tendon was   debrided back to healthy appearing tissue. A Bunnell weave type   stitch was placed, fixing the Biomet Ziploc anchor system to the   distal biceps. The radial tuberosity was exposed at the original   insertion. This was verified under fluoroscopy. An interosseous   tunnel was appropriately reamed and the Ziploc anchor was placed   out the distal cortex and the biceps was anatomically repaired   into the interosseous tunnel. Fluoroscopy was used to verify   acceptable position of the implant. Anatomic repair of the   distal biceps was performed without difficulty. Wounds were   irrigated, closed in layers. Sterile dressings and a posterior   and medial lateral splints were applied.     The patient was extubated and taken to recovery room in stable   condition.     Gayla Dossarl Howell, PA, was utilized during the procedure. He was   necessary for opening, closing, splint application and assisting   during the major portions of the procedure, including repair of   the distal biceps with appropriate positioning of the arm in   supination at all times. His presence decreased the operative   time and potential complication risk.        Germaine PomfretHOMAS E. Loredana Medellin, MD      TEB / [place]   D:  02/04/2015   12:28   T:  02/04/2015   12:47   Job #:  [id number]

## 2015-02-04 NOTE — H&P (Signed)
Outpatient Surgery History and Physical:  Ronald LongestMichael Villafuerte Jr. was seen and examined.    CHIEF COMPLAINT:   Left elbow pain.     PE:     Visit Vitals   ??? BP 122/75   ??? Pulse 71   ??? Temp 98.3 ??F (36.8 ??C)   ??? Resp 16   ??? SpO2 97%       Heart:   Regular rhythm      Lungs:  Are clear      Past Medical History:    Patient Active Problem List    Diagnosis   ??? Dyspnea   ??? Acute sinusitis   ??? Tinea pedis   ??? Cellulitis   ??? URI (upper respiratory infection)   ??? Situational anxiety       Surgical History:   Past Surgical History   Procedure Laterality Date   ??? Hx other surgical       liposuction under arms   ??? Hx other surgical       LEFT WRIST GANGLION CYST       Social History: Patient  reports that he has never smoked. He has never used smokeless tobacco. He reports that he drinks alcohol. He reports that he does not use illicit drugs.    Family History:   Family History   Problem Relation Age of Onset   ??? Cancer Father      prostate       Allergies: Reviewed per EMR  No Known Allergies    Medications:    No current facility-administered medications on file prior to encounter.      Current Outpatient Prescriptions on File Prior to Encounter   Medication Sig   ??? clorazepate (TRANXENE) 3.75 mg tablet Take 1 Tab by mouth two (2) times a day. Max Daily Amount: 7.5 mg. (Patient taking differently: Take 3.75 mg by mouth as needed for Sleep.)   ??? valACYclovir (VALTREX) 500 mg tablet Take 1 Tab by mouth as needed. Indications: SUPPRESSION OF RECURRENT HERPES SIMPLEX INFECTION       The surgery is planned for the left elbow.        History and physical has been reviewed. The patient has been examined. There have been no significant clinical changes since the completion of the originally dated History and Physical.  Patient identified by surgeon; surgical site was confirmed by patient and surgeon.      The patient is here today for outpatient surgery. I have examined the  patient, no changes are noted in the patient's medical status. Necessity for the procedure/care is still present and the history and physical above is current.  See the office notes for the full long term history of the problem.  Please see the recent office notes for the musculoskeletal examination.    Signed By: Olen Cordialarl A Kejon Feild, PA     February 04, 2015 11:13 AM

## 2015-02-04 NOTE — Anesthesia Pre-Procedure Evaluation (Signed)
Anesthetic History   No history of anesthetic complications            Review of Systems / Medical History  Patient summary reviewed and pertinent labs reviewed    Pulmonary  Within defined limits                 Neuro/Psych   Within defined limits           Cardiovascular                  Exercise tolerance: >4 METS     GI/Hepatic/Renal  Within defined limits              Endo/Other  Within defined limits           Other Findings              Physical Exam    Airway  Mallampati: I  TM Distance: 4 - 6 cm  Neck ROM: normal range of motion   Mouth opening: Normal     Cardiovascular  Regular rate and rhythm,  S1 and S2 normal,  no murmur, click, rub, or gallop  Rhythm: regular  Rate: normal         Dental  No notable dental hx       Pulmonary  Breath sounds clear to auscultation               Abdominal  GI exam deferred       Other Findings            Anesthetic Plan    ASA: 1  Anesthesia type: general            Anesthetic plan and risks discussed with: Patient

## 2015-02-04 NOTE — Anesthesia Procedure Notes (Signed)
Peripheral Block    Start time: 02/04/2015 10:54 AM  End time: 02/04/2015 10:58 AM  Performed by: Kamir Selover B  Authorized by: Tanika Bracco B       Pre-procedure:   Indications: at surgeon's request, post-op pain management and procedure for pain    Preanesthetic Checklist: patient identified, risks and benefits discussed, site marked, timeout performed, anesthesia consent given and patient being monitored    Timeout Time: 10:54          Block Type:   Block Type:  Supraclavicular  Laterality:  Left  Monitoring:  Standard ASA monitoring, responsive to questions, continuous pulse ox, oxygen, frequent vital sign checks and heart rate  Injection Technique:  Single shot  Procedures: ultrasound guided    Patient Position: seated  Prep: chlorhexidine    Location:  Supraclavicular  Needle Type:  Stimuplex  Needle Gauge:  22 G  Needle Localization:  Ultrasound guidance  Medication Injected:  0.5%  ropivacaine  Volume (mL):  30    Assessment:  Number of attempts:  1  Injection Assessment:  Incremental injection every 5 mL, negative aspiration for CSF, no paresthesia, ultrasound image on chart, no intravascular symptoms, negative aspiration for blood and local visualized surrounding nerve on ultrasound  Patient tolerance:  Patient tolerated the procedure well with no immediate complications

## 2015-02-04 NOTE — Anesthesia Post-Procedure Evaluation (Signed)
Post-Anesthesia Evaluation and Assessment    Patient: Ronald LongestMichael Rickels Jr. MRN: [id number]  SSN: [id number]    Date of Birth: Nov 17, 1975  Age: 39 y.o.  Sex: male       Cardiovascular Function/Vital Signs  Visit Vitals   ??? BP 123/67   ??? Pulse 68   ??? Temp 36.6 ??C (97.8 ??F)   ??? Resp 17   ??? SpO2 98%       Patient is status post general anesthesia for Procedure(s):  LEFT DISTAL BICEP(S) REPAIR / OPEN.    Nausea/Vomiting: None    Postoperative hydration reviewed and adequate.    Pain:  Pain Intensity 1: 0 (02/04/15 1107)   Managed    Neurological Status:   Neuro (WDL): Within Defined Limits (02/04/15 1030)   At baseline    Mental Status and Level of Consciousness: Arousable    Pulmonary Status:   O2 Device: Nasal cannula (02/04/15 1246)   Adequate oxygenation and airway patent    Complications related to anesthesia: None    Post-anesthesia assessment completed. No concerns    Signed By: Barbaraann FasterMatthew B Maygen Sirico, MD     February 04, 2015

## 2015-02-10 ENCOUNTER — Encounter: Payer: BLUE CROSS/BLUE SHIELD | Attending: Nurse Practitioner | Primary: Nurse Practitioner

## 2015-03-15 ENCOUNTER — Inpatient Hospital Stay: Admit: 2015-03-15 | Payer: BLUE CROSS/BLUE SHIELD | Primary: Nurse Practitioner

## 2015-03-15 ENCOUNTER — Ambulatory Visit
Admit: 2015-03-15 | Discharge: 2015-03-15 | Payer: BLUE CROSS/BLUE SHIELD | Attending: Nurse Practitioner | Primary: Nurse Practitioner

## 2015-03-15 DIAGNOSIS — Z0000 Encounter for general adult medical examination without abnormal findings: Secondary | ICD-10-CM

## 2015-03-15 DIAGNOSIS — M25522 Pain in left elbow: Secondary | ICD-10-CM

## 2015-03-15 NOTE — Progress Notes (Signed)
CHIEF COMPLAINT:   Chief Complaint   Patient presents with   ??? Complete Physical     HISTORY OF PRESENT ILLNESS: Mr. Sorbello is a 39 y.o. WHITE OR CAUCASIAN male with need for CPE.  He is feeling well and has no current complaints. Cholesterol readings were high last year. BMI is mildly elevated at 29. Doesn't do regular exercise but tries to stay active. Has PRN meds for HSV 1, intermittent insomnia and intermittent anxiety and seasonal allergic rhinitis. Got a flu shot at work. He has no need for refills. He recently had biceps tendon repair in October and has healed well. His restrictions are now lifted and he goes to PTx today. He has strong family h/o prostate cancer and DRE last year was normal.       HISTORY:  No Known Allergies  Past Medical History   Diagnosis Date   ??? Left wrist pain      Past Surgical History   Procedure Laterality Date   ??? Hx other surgical       liposuction under arms   ??? Hx other surgical       LEFT WRIST GANGLION CYST   ??? Hx other surgical Left 02/04/2015     Torn left bicep tendon repair     Family History   Problem Relation Age of Onset   ??? Cancer Father      prostate     Social History     Social History   ??? Marital status: MARRIED     Spouse name: N/A   ??? Number of children: N/A   ??? Years of education: N/A     Occupational History   ??? Not on file.     Social History Main Topics   ??? Smoking status: Never Smoker   ??? Smokeless tobacco: Never Used   ??? Alcohol use Yes      Comment: doesn't drink on a weekly basis   ??? Drug use: No   ??? Sexual activity: Not on file     Other Topics Concern   ??? Not on file     Social History Narrative     Current Outpatient Prescriptions   Medication Sig Dispense Refill   ??? NOREL AD 4-10-325 mg tab TAKE 1 TABLET BY MOUTH TWICE A DAY  2   ??? ibuprofen 200 mg cap Take  by mouth as needed.     ??? omega-3 fatty acids-vitamin e (FISH OIL) 1,000 mg cap Take 1 Cap by mouth daily.     ??? clorazepate (TRANXENE) 3.75 mg tablet Take 1 Tab by mouth two (2) times  a day. Max Daily Amount: 7.5 mg. (Patient taking differently: Take 3.75 mg by mouth as needed for Sleep.) 36 Tab 2   ??? valACYclovir (VALTREX) 500 mg tablet Take 1 Tab by mouth as needed. 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 ondansetron (ZOFRAN ODT) 4 mg disintegrating tablet TAKE 1 TABLET(S) UNDER THE TONGUE EVERY 4-6 HOURS AS NEEDED  0       REVIEW OF SYSTEMS:   GENERAL/CONSTITUTIONAL: Negative for  - chills, fatigue, fever, night sweats, sleep disturbance, weight changes  HEAD, EYES, EARS, NOSE AND THROAT: Negative for - headaches, hearing change,  oral lesions, sinus pain, sore throat, vertigo, visual changes  CARDIOVASCULAR: Negative for - chest pain, edema, irregular heartbeat, loss of consciousness, orthopnea, palpitations, PND, DOE  RESPIRATORY:  Negative for - hemoptysis, orthopnea, pleuritic pain, shortness of breath, tachypnea, wheezing  GASTROINTESTINAL: Negative for - abdominal pain, appetite loss, blood in stools, change in bowel habits, heartburn, hematemesis, melena, dysphagia  GENITOURINARY: Negative for - dysuria, hematuria  MUSCULOSKELETAL: Negative for - gait disturbance, joint pain, joint stiffness, joint swelling  Positive for-improving post op muscle pain and muscular weakness  SKIN:  Negative for -  hair changes, mole changes, pruritus, rash, skin lesion changes  NEUROLOGIC: Negative for - behavioral changes, confusion, dizziness, gait disturbance,  impaired coordination/balance, memory loss, seizures, speech problems  PSYCHIATRIC: Negative for - anxiety, behavioral disorder, concentration difficulties, depression  ENDOCRINE:  Negative for - malaise/lethargy, polydipsia/polyuria, skin changes, unexpected weight changes  HEMATOLOGIC/LYMPHATIC:  Negative for - bleeding problems, bruising, fatigue, jaundice, night sweats, pallor  ALLERGIC/IMMUNOLOGIC:  Positive for -  nasal congestion, postnasal drip, seasonal allergies    PHYSICAL EXAM:  Vital Signs -    Visit Vitals   ??? BP 130/83   ??? Pulse 83   ??? Resp 16   ??? Ht $Remo'6\' 2"'nfzAM$  (1.88 m)   ??? Wt 229 lb (103.9 kg)   ??? BMI 29.4 kg/m2      Constitutional - alert, well appearing, and in no distress.  Eyes - pupils equal and reactive, extraocular eye movements intact. Conjunctiva pink, sclera clear.   Ear, Nose, Mouth, Throat - external inspection of ears and nose is normal. OP normal.   Neck - supple, no significant adenopathy. No thyromegaly.  Respiratory - clear to auscultation, no wheezes, rales or rhonchi, respirations nonlabored.  Cardiovascular - S1S2 RRR no M/R/G no pedal edema +2pp, no carotid bruits.   Gastrointestinal - Abdomen soft, non tender, non distended, no masses.normoactive BSx4.   Examination of liver and spleen no organomegaly    Genitourinary : negative CVA tenderness  Musculoskeletal - No joint tenderness, deformity or swelling. MAEW. Normal and equal grip.   Skin - no rash, warm and dry  Neurological -. Motor sensory and gait is normal . Speech is normal  Extremities - peripheral pulses normal, no pedal edema, no clubbing or cyanosis  Psychiatric - alert, oriented to person, place, and time.      LABS  Results for orders placed or performed in visit on 95/18/84   METABOLIC PANEL, COMPREHENSIVE   Result Value Ref Range    Glucose 87 65 - 99 mg/dL    BUN 19 6 - 20 mg/dL    Creatinine 1.14 0.76 - 1.27 mg/dL    GFR est non-AA 81 >59 mL/min/1.73    GFR est AA 94 >59 mL/min/1.73    BUN/Creatinine ratio 17 8 - 19    Sodium 140 134 - 144 mmol/L    Potassium 4.5 3.5 - 5.2 mmol/L    Chloride 99 97 - 108 mmol/L    CO2 24 18 - 29 mmol/L    Calcium 8.9 8.7 - 10.2 mg/dL    Protein, total 7.1 6.0 - 8.5 g/dL    Albumin 4.4 3.5 - 5.5 g/dL    GLOBULIN, TOTAL 2.7 1.5 - 4.5 g/dL    A-G Ratio 1.6 1.1 - 2.5    Bilirubin, total 0.4 0.0 - 1.2 mg/dL    Alk. phosphatase 61 39 - 117 IU/L    AST 30 0 - 40 IU/L    ALT 14 0 - 44 IU/L   AMB POC LIPID PROFILE   Result Value Ref Range    Cholesterol (POC) 210 (A) 100 - 199     Triglycerides (POC) 259 (A) 0 - 149    HDL Cholesterol (POC)  24 (A) 39    LDL Cholesterol (POC) 134 (A) 0 - 99    Non-HDL Goal (POC) 186 (A) 160    TChol/HDL Ratio (POC) 8.6 (A) 0.0 - 3.6   AMB POC TSH   Result Value Ref Range    TSH POC 3.04 uIU/ml   AMB POC KTY COMPLETE CBC   Result Value Ref Range    WBC (POC) 7.1 K/uL    LYMPHOCYTES (POC) 35.3 %    MONOCYTES (POC) 2.0 %    GRANULOCYTES (POC) 62.7 %    ABS. LYMPHS (POC) 2.5 K/uL    ABS. MONOS (POC) 0.1 K/uL    GR# POC 4.5 K/uL    RBC (POC) 4.98 (A) 4.50 - 4.90 M/uL    HGB (POC) 14.9 g/dL    HCT (POC) 44.5 %    MCV (POC) 89.4 fL    MCH (POC) 29.9 pg    MCHC (POC) 33.5 g/dL    RDW (POC) 12.6 (A) 13.2 - 16.0 %    PLATELET (POC) 163 K/uL    MPV (POC) 9.4 fL           IMPRESSION    ICD-10-CM ICD-9-CM    1. Routine general medical examination at a health care facility Z00.00 V70.0    2. Hypercholesterolemia E78.00 272.0    3. Situational anxiety F41.8 300.09    4. Family history of prostate cancer Z80.42 V16.42        PLAN :See orders above.  Patient instructed to call with any questions concerns or progression in symptoms.    Discussed wellness goals- regular exercise, [place]itter[place] Diet, and will f/u on labs especially cholesterol levels.   Discussed family h/o prostate cancer- include PSA.   F/u after above and o/w in 1 year or PRN.           Leta Speller, NP          Dictated using voice recognition software. Proofread, but unrecognized voice recognition errors may exist.

## 2015-03-15 NOTE — Patient Instructions (Addendum)
A Healthy Lifestyle: Care Instructions  Your Care Instructions  A healthy lifestyle can help you feel good, stay at a healthy weight, and have plenty of energy for both work and play. A healthy lifestyle is something you can share with your whole family.  A healthy lifestyle also can lower your risk for serious health problems, such as high blood pressure, heart disease, and diabetes.  You can follow a few steps listed below to improve your health and the health of your family.  Follow-up care is a key part of your treatment and safety. Be sure to make and go to all appointments, and call your doctor if you are having problems. It???s also a good idea to know your test results and keep a list of the medicines you take.  How can you care for yourself at home?  ?? Do not eat too much sugar, fat, or fast foods. You can still have dessert and treats now and then. The goal is moderation.  ?? Start small to improve your eating habits. Pay attention to portion sizes, drink less juice and soda pop, and eat more fruits and vegetables.  ?? Eat a healthy amount of food. A 3-ounce serving of meat, for example, is about the size of a deck of cards. Fill the rest of your plate with vegetables and whole grains.  ?? Limit the amount of soda and sports drinks you have every day. Drink more water when you are thirsty.  ?? Eat at least 5 servings of fruits and vegetables every day. It may seem like a lot, but it is not hard to reach this goal. A serving or helping is 1 piece of fruit, 1 cup of vegetables, or 2 cups of leafy, raw vegetables. Have an apple or some carrot sticks as an afternoon snack instead of a candy bar. Try to have fruits and/or vegetables at every meal.  ?? Make exercise part of your daily routine. You may want to start with simple activities, such as walking, bicycling, or slow swimming. Try to be active 30 to 60 minutes every day. You do not need to do all 30 to 60  minutes all at once. For example, you can exercise 3 times a day for 10 or 20 minutes. Moderate exercise is safe for most people, but it is always a good idea to talk to your doctor before starting an exercise program.  ?? Keep moving. Mow the lawn, work in the garden, or clean your house. Take the stairs instead of the elevator at work.  ?? If you smoke, quit. People who smoke have an increased risk for heart attack, stroke, cancer, and other lung illnesses. Quitting is hard, but there are ways to boost your chance of quitting tobacco for good.  ?? Use nicotine gum, patches, or lozenges.  ?? Ask your doctor about stop-smoking programs and medicines.  ?? Keep trying.  In addition to reducing your risk of diseases in the future, you will notice some benefits soon after you stop using tobacco. If you have shortness of breath or asthma symptoms, they will likely get better within a few weeks after you quit.  ?? Limit how much alcohol you drink. Moderate amounts of alcohol (up to 2 drinks a day for men, 1 drink a day for women) are okay. But drinking too much can lead to liver problems, high blood pressure, and other health problems.  Family health  If you have a family, there are many things you can do   together to improve your health.  ?? Eat meals together as a family as often as possible.  ?? Eat healthy foods. This includes fruits, vegetables, lean meats and dairy, and whole grains.  ?? Include your family in your fitness plan. Most people think of activities such as jogging or tennis as the way to fitness, but there are many ways you and your family can be more active. Anything that makes you breathe hard and gets your heart pumping is exercise. Here are some tips:  ?? Walk to do errands or to take your child to school or the bus.  ?? Go for a family bike ride after dinner instead of watching TV.  Where can you learn more?  Go to http://www.healthwise.net/[employer]   Enter U807 in the search box to learn more about "A Healthy Lifestyle: Care Instructions."  ?? 2006-2016 [employer]. Care instructions adapted under license by [employer] (which disclaims liability or warranty for this information). This care instruction is for use with your licensed healthcare professional. If you have questions about a medical condition or this instruction, always ask your healthcare professional. Healthwise, Incorporated disclaims any warranty or liability for your use of this information.  Content Version: 11.0.578772; Current as of: March 05, 2014

## 2015-03-15 NOTE — Progress Notes (Signed)
Ambulatory/Rehab Services H2 Model Falls Risk Assessment    Risk Factor Pts. ??   Confusion/Disorientation/Impulsivity      4 ??   Symptomatic Depression     2 ??   Altered Elimination     1 ??   Dizziness/Vertigo     1 ??   Gender (Male)     1 ??   Any administered antiepileptics (anticonvulsants):     2 ??   Any administered benzodiazepines:     1 ??   Visual Impairment (specify):     1 ??   Portable Oxygen Use     1 ??   Orthostatic ? BP     1 ??   History of Recent Falls (within 3 mos.)     5     Ability to Rise from Chair (choose one) Pts. ??   Ability to rise in a single movement     0 ??   Pushes up, successful in one attempt     1 ??   Multiple attempts, but successful     3 ??   Unable to rise without assistance     4   Total: (5 or greater = High Risk) 1     Falls Prevention Plan:                   Physical Limitations to Exercise (specify):                   Mobility Assistance Device (type):                   Exercise/Equipment Adaptation (specify):    ??2010 [employer] All Rights Reserved. [place] Patent #7,[id number]. Federal Law prohibits the replication, distribution or use without written permission from [employer]

## 2015-03-15 NOTE — Addendum Note (Signed)
Addended by: Beverley FiedlerPRZEKOP, Anhelica Fowers R on: 03/15/2015 03:33 PM      Modules accepted: Orders

## 2015-03-15 NOTE — Progress Notes (Signed)
Ronald Fields.   (DOB:October 31, 1975) Therapy Center at   [facility]  [address], [place] [address]  Phone:[phone]   Fax:[phone]         Outpatient PHYSICAL THERAPY: Initial Assessment 03/15/2015      Fall Risk Score: 1 (? 5 = High Risk)    ICD-10: Treatment Diagnosis:  Stiffness in joint; left elbow (M25.522), stiffness of joint; left wrist (M25.532), pain in limb, unspecified, arm (M79.602)     REFERRING PHYSICIAN: Germaine Pomfret, MD  MD Orders: evaluate and treat  Return Physician Appointment: in 4 weeks   MEDICAL/REFERRING DIAGNOSIS:(S46.212A) strain of muscle, fascia and tendon of other parts of biceps, left arm   DATE OF ONSET: surgery: 02/04/15    PRIOR LEVEL OF FUNCTION: independent, active  PRECAUTIONS/ALLERGIES: No Known Allergies    ASSESSMENT:  ????????This section established at most recent assessment??????????  Ronald Fields. arrives for PT evaluation 5 weeks s/p left distal bicipital repair. He demonstrates functional limitations due to elbow and forearm joint stiffness and pain.    PROBLEM LIST (Impairments causing functional limitations):  1. Decreased Strength affecting function  2. Decreased ADL/Functional Activities  3. Decreased Flexibility/joint mobility  4. Increased Pain affecting function  5. Other decreased knowledge of HEP  GOALS: (Goals have been discussed and agreed upon with patient.)  SHORT-TERM FUNCTIONAL GOALS: Time Frame: 2-4 weeks   1. Pt will be independent with HEP.  2. Pt will report left UE pain does not exceed 2/10 during functional activities.   3. Pt will improve left elbow extension by 10 degrees or greater to improve functional mobility.    DISCHARGE GOALS: Time Frame: 6-8 week  1. Pt will demonstrate 5/5 strength forearm and elbow strength all planes.  2. Pt will report no limitations from left UE pain or stiffness during recreational and home activities.   3. Pt will demonstrate DASH score improvement by at least 10 points.     REHABILITATION POTENTIAL FOR STATED GOALS: GoodPLAN OF CARE:  INTERVENTIONS PLANNED: (Benefits and precautions of physical therapy have been discussed with the patient.)  1. cold  2. heat  3. home exercise program (HEP)  4. manual therapy  5. neuromuscular re-education/strengthening  6. range of motion: active/assisted/passive  7. therapeutic activities  8. therapeutic exercise/strengthening  TREATMENT PLAN EFFECTIVE DATES: 11.29.16 TO 1.29.17  FREQUENCY/DURATION: Follow patient 2 times a week for 4-8 weeks to address above goals.  Regarding Ronald Hsiao Jr.'s therapy, I certify that the treatment plan above will be carried out by a therapist or under their direction.  Thank you for this referral,  Wilber Oliphant, PT     Referring Physician Signature: Germaine Pomfret, MD          Date                       SUBJECTIVE:  03/15/2015   History of Present Injury/Illness (Reason for Referral):   Pt states that on 01/28/15 he felt left bicep pain while lifting weights.  He did not alter any activities at that time and played full game of rugby.  Over the next few days, he experienced significant arm pain and elbow restriction.  Went to urgent care and was then referred to orthopedist.  Dx with distal bicep rupture. Bicipital repair performed on 02/04/15. Pt reports left elbow was immobilized in a splint for 5 weeks. No other rehab prior to today.      Present Symptoms:  intermittent moderate pain of left elbow, wrist, shoulder     Aggravating Factors: lifting weight, lifting his children, dressing    Relieving Factors: ibuprofen   Pain level: 1-2/10 presently, 5/10 worst, 1/10 best      Dominant Side: right  Past Medical History:   Past Medical History   Diagnosis Date   ??? Left wrist pain      Current Medications:   Current Outpatient Prescriptions:   ???  NOREL AD 4-10-325 mg tab, TAKE 1 TABLET BY MOUTH TWICE A DAY, Disp: , Rfl: 2   ???  ondansetron (ZOFRAN ODT) 4 mg disintegrating tablet, TAKE 1 TABLET(S) UNDER THE TONGUE EVERY 4-6 HOURS AS NEEDED, Disp: , Rfl: 0  ???  ibuprofen 200 mg cap, Take  by mouth as needed., Disp: , Rfl:   ???  omega-3 fatty acids-vitamin e (FISH OIL) 1,000 mg cap, Take 1 Cap by mouth daily., Disp: , Rfl:   ???  clorazepate (TRANXENE) 3.75 mg tablet, Take 1 Tab by mouth two (2) times a day. Max Daily Amount: 7.5 mg. (Patient taking differently: Take 3.75 mg by mouth as needed for Sleep.), Disp: 36 Tab, Rfl: 2  ???  valACYclovir (VALTREX) 500 mg tablet, Take 1 Tab by mouth as needed. Indications: SUPPRESSION OF RECURRENT HERPES SIMPLEX INFECTION, Disp: 30 Tab, Rfl: 3   Date Last Reviewed: 03/15/2015   Social History/Home Situation:   2 story home, lives with wife and 3 children     Work/Activity History:   full time Journalist, newspaper (typing, desk work, occasional lifting)  OBJECTIVE:    Outcome Measure:   Tool Used: Disabilities of the Arm, Shoulder and Hand (DASH) Questionnaire - Quick Version  Score:  Initial: 35/55  Most Recent: X/55 (Date: -- )   Interpretation of Score: The DASH is designed to measure the activities of daily living in person's with upper extremity dysfunction or pain.  Each section is scored on a 1-5 scale, 5 representing the greatest disability.  The scores of each section are added together for a total score of 55.    Observation/Orthostatic Postural Assessment:      no abnormalities  Palpation:      Surgical site healed, scar proximal of cubital fossa    ROM:   Shoulder ROM  DATE  11.29.16 DATE     flexion R: WNL   L:  WNL  R:   L:    abd R: WNL  L:  WNL R:   L:    External rotation  R:  WFL   L:  WFL R:   L:    Internal rotation R: WFL   L: WFL R:   L:    Elbow flexion  R: 135  L:  135 R:   L:    Elbow extension  R: 0  L: -15 R:   L:     Strength:    DATE  03/15/15 DATE     Shoulder flexion R: 5  L: 4- R:   L:    Shoulder abd R: 5  L: 4 R:   L:    shoulder External rotation  R: 5  L: 4+ R:    L:    shoulder Internal rotation R: 5  L: 4+ R:   L:    Elbow flexion  R: 5  L:  4- R:   L:    Elbow extension  R: 5  L: 4+ R:   L:   forearm Pronation  R: 5  L: 4    forearm Supination  R: 5  L: 4-     Wrist flexion  R: 5  L: 4+    Wrist extension  R:5   L: 4+      Special Tests: N/A  Neurological Screen:   Myotomes: normal                  Dermatomes: normal                  Reflexes: not tested                  Neural Tension Tests: not tested   Functional Mobility:      difficulty with lifting and reaching using left UE   Balance:      Good   TREATMENT:    (In addition to Assessment/Re-Assessment sessions the following treatments were rendered)  THERAPEUTIC EXERCISE: (10 minutes):  Exercises per grid below to improve mobility and strength.  Required moderate visual, verbal and manual cues to promote proper body alignment, promote proper body posture, promote proper body mechanics and promote proper body breathing techniques.  Progressed resistance, range and repetitions as indicated.   Date:  03/15/15 Date:   Date:     Activity/Exercise Parameters Parameters Parameters   shoulder flexion  neutral position supine  X 20      Elbow flexion/extension  X 20     Wrist supination/pronation  X 20                                Manual Therapy (      ):   ?? OP with left elbow extension and forearm supination and pronation   Therapeutic Modalities:                                                                                               HEP: As above; handouts given to patient for all exercises.  ______________________________________________________________________________________________________    Treatment Assessment:    Pt with good understanding of HEP. Needs cuing to decrease compensatory movements during exercises.    Next treatment appointment scheduled:  Future Appointments  Date Time Provider Department Center   03/17/2015 2:30 PM Eyvonne Left [facility]    03/22/2015 8:15 AM CARLEY, SCOTT [facility]   03/24/2015 7:45 AM CARLEY, SCOTT [facility]   03/29/2015 7:45 AM Wilber Oliphant, PT [facility]   03/31/2015 7:45 AM Wilber Oliphant, PT [facility]   04/05/2015 7:45 AM Wilber Oliphant, PT [facility]   04/07/2015 7:45 AM Wilber Oliphant, PT [facility]   04/19/2015 7:45 AM Wilber Oliphant, PT [facility]   04/21/2015 7:45 AM Wilber Oliphant, PT [facility]     Progression/Medical Necessity:   ?? Patient is expected to demonstrate progress in strength and range of motion to decrease assistance required with recreational and home activities .  Compliance with Program/Exercises: Will assess as treatment progresses.  Reason for Continuation of Services/Other Comments:  ?? Patient continues to require modification of therapeutic interventions to increase complexity of exercises.  Recommendations/Intent for next treatment session: "Treatment next visit will focus on advancements to more challenging activities". AROM and PROM of left elbow, forearm and shoulder, scar mobilization, ice    Total Treatment Duration:  PT Patient Time In/Time Out  Time In: 1000  Time Out: 1100    Wilber OliphantKathryn A Jaquel Glassburn, PT

## 2015-03-16 LAB — CBC WITH AUTOMATED DIFF
ABS. BASOPHILS: 0 10*3/uL (ref 0.0–0.2)
ABS. EOSINOPHILS: 0.2 10*3/uL (ref 0.0–0.4)
ABS. IMM. GRANS.: 0 10*3/uL (ref 0.0–0.1)
ABS. MONOCYTES: 0.5 10*3/uL (ref 0.1–0.9)
ABS. NEUTROPHILS: 3.9 10*3/uL (ref 1.4–7.0)
Abs Lymphocytes: 2.3 10*3/uL (ref 0.7–3.1)
BASOPHILS: 0 %
EOSINOPHILS: 3 %
HCT: 40.8 % (ref 37.5–51.0)
HGB: 13.9 g/dL (ref 12.6–17.7)
IMMATURE GRANULOCYTES: 0 %
Lymphocytes: 34 %
MCH: 30 pg (ref 26.6–33.0)
MCHC: 34.1 g/dL (ref 31.5–35.7)
MCV: 88 fL (ref 79–97)
MONOCYTES: 7 %
NEUTROPHILS: 56 %
PLATELET: 162 10*3/uL (ref 150–379)
RBC: 4.63 x10E6/uL (ref 4.14–5.80)
RDW: 14 % (ref 12.3–15.4)
WBC: 7 10*3/uL (ref 3.4–10.8)

## 2015-03-16 LAB — METABOLIC PANEL, COMPREHENSIVE
A-G Ratio: 1.5 (ref 1.1–2.5)
ALT (SGPT): 15 IU/L (ref 0–44)
AST (SGOT): 23 IU/L (ref 0–40)
Albumin: 4.1 g/dL (ref 3.5–5.5)
Alk. phosphatase: 55 IU/L (ref 39–117)
BUN/Creatinine ratio: 23 — ABNORMAL HIGH (ref 8–19)
BUN: 22 mg/dL — ABNORMAL HIGH (ref 6–20)
Bilirubin, total: 0.3 mg/dL (ref 0.0–1.2)
CO2: 25 mmol/L (ref 18–29)
Calcium: 8.8 mg/dL (ref 8.7–10.2)
Chloride: 104 mmol/L (ref 97–106)
Creatinine: 0.94 mg/dL (ref 0.76–1.27)
GFR est AA: 118 mL/min/{1.73_m2} (ref >59)
GFR est non-AA: 102 mL/min/{1.73_m2} (ref >59)
GLOBULIN, TOTAL: 2.8 g/dL (ref 1.5–4.5)
Glucose: 82 mg/dL (ref 65–99)
Potassium: 4.7 mmol/L (ref 3.5–5.2)
Protein, total: 6.9 g/dL (ref 6.0–8.5)
Sodium: 143 mmol/L (ref 136–144)

## 2015-03-16 LAB — HEMOGLOBIN A1C WITH EAG
Estimated average glucose: 120 mg/dL
Hemoglobin A1c: 5.8 % — ABNORMAL HIGH (ref 4.8–5.6)

## 2015-03-16 LAB — LIPID PANEL WITH LDL/HDL RATIO
Cholesterol, total: 193 mg/dL (ref 100–199)
HDL Cholesterol: 41 mg/dL (ref >39)
LDL, calculated: 120 mg/dL — ABNORMAL HIGH (ref 0–99)
LDL/HDL Ratio: 2.9 ratio units (ref 0.0–3.6)
Triglyceride: 161 mg/dL — ABNORMAL HIGH (ref 0–149)
VLDL, calculated: 32 mg/dL (ref 5–40)

## 2015-03-16 LAB — TSH 3RD GENERATION: TSH: 3.57 u[IU]/mL (ref 0.450–4.500)

## 2015-03-16 LAB — PSA, DIAGNOSTIC (PROSTATE SPECIFIC AG): Prostate Specific Ag: 0.9 ng/mL (ref 0.0–4.0)

## 2015-03-17 ENCOUNTER — Inpatient Hospital Stay: Admit: 2015-03-17 | Payer: BLUE CROSS/BLUE SHIELD | Primary: Nurse Practitioner

## 2015-03-17 DIAGNOSIS — M25522 Pain in left elbow: Secondary | ICD-10-CM

## 2015-03-17 NOTE — Progress Notes (Signed)
Ronald Fields.   (DOB:1976/02/15) Therapy Center at   [facility]  [address], [place] [address]  [phone]   Fax:[phone]         Outpatient PHYSICAL THERAPY: Daily Note 03/17/2015      Fall Risk Score: 1 (? 5 = High Risk)    ICD-10: Treatment Diagnosis:  Stiffness in joint; left elbow (M25.522), stiffness of joint; left wrist (M25.532), pain in limb, unspecified, arm (M79.602)     REFERRING PHYSICIAN: Germaine Pomfret, MD  MD Orders: evaluate and treat  Return Physician Appointment: in 4 weeks   MEDICAL/REFERRING DIAGNOSIS:(S46.212A) strain of muscle, fascia and tendon of other parts of biceps, left arm   DATE OF ONSET: surgery: 02/04/15    PRIOR LEVEL OF FUNCTION: independent, active  PRECAUTIONS/ALLERGIES: No Known Allergies    ASSESSMENT:  ????????This section established at most recent assessment??????????  Ronald Fields. arrives for PT evaluation 5 weeks s/p left distal bicipital repair. He demonstrates functional limitations due to elbow and forearm joint stiffness and pain.    PROBLEM LIST (Impairments causing functional limitations):  1. Decreased Strength affecting function  2. Decreased ADL/Functional Activities  3. Decreased Flexibility/joint mobility  4. Increased Pain affecting function  5. Other decreased knowledge of HEP  GOALS: (Goals have been discussed and agreed upon with patient.)  SHORT-TERM FUNCTIONAL GOALS: Time Frame: 2-4 weeks   1. Pt will be independent with HEP.  2. Pt will report left UE pain does not exceed 2/10 during functional activities.   3. Pt will improve left elbow extension by 10 degrees or greater to improve functional mobility.    DISCHARGE GOALS: Time Frame: 6-8 week  1. Pt will demonstrate 5/5 strength forearm and elbow strength all planes.  2. Pt will report no limitations from left UE pain or stiffness during recreational and home activities.   3. Pt will demonstrate DASH score improvement by at least 10 points.     REHABILITATION POTENTIAL FOR STATED GOALS: GoodPLAN OF CARE:  INTERVENTIONS PLANNED: (Benefits and precautions of physical therapy have been discussed with the patient.)  1. cold  2. heat  3. home exercise program (HEP)  4. manual therapy  5. neuromuscular re-education/strengthening  6. range of motion: active/assisted/passive  7. therapeutic activities  8. therapeutic exercise/strengthening  TREATMENT PLAN EFFECTIVE DATES: 11.29.16 TO 1.29.17  FREQUENCY/DURATION: Follow patient 2 times a week for 4-8 weeks to address above goals.  Regarding Darrie Macmillan Jr.'s therapy, I certify that the treatment plan above will be carried out by a therapist or under their direction.  Thank you for this referral,  Nadeen Landau, PT, DPT     Referring Physician Signature: Germaine Pomfret, MD          Date                       SUBJECTIVE:  03/17/2015   Patient states he is doing well, just mild soreness at the biceps.     History of Present Injury/Illness (Reason for Referral):   Pt states that on 01/28/15 he felt left bicep pain while lifting weights.  He did not alter any activities at that time and played full game of rugby.  Over the next few days, he experienced significant arm pain and elbow restriction.  Went to urgent care and was then referred to orthopedist.  Dx with distal bicep rupture. Bicipital repair performed on 02/04/15. Pt reports left elbow was immobilized in a  splint for 5 weeks. No other rehab prior to today.      Present Symptoms: intermittent moderate pain of left elbow, wrist, shoulder     Aggravating Factors: lifting weight, lifting his children, dressing    Relieving Factors: ibuprofen   Pain level: 1-2/10 presently, 5/10 worst, 1/10 best      Dominant Side: right  Past Medical History:   Past Medical History   Diagnosis Date   ??? Left wrist pain      Current Medications:   Current Outpatient Prescriptions:    ???  NOREL AD 4-10-325 mg tab, TAKE 1 TABLET BY MOUTH TWICE A DAY, Disp: , Rfl: 2  ???  ondansetron (ZOFRAN ODT) 4 mg disintegrating tablet, TAKE 1 TABLET(S) UNDER THE TONGUE EVERY 4-6 HOURS AS NEEDED, Disp: , Rfl: 0  ???  ibuprofen 200 mg cap, Take  by mouth as needed., Disp: , Rfl:   ???  omega-3 fatty acids-vitamin e (FISH OIL) 1,000 mg cap, Take 1 Cap by mouth daily., Disp: , Rfl:   ???  clorazepate (TRANXENE) 3.75 mg tablet, Take 1 Tab by mouth two (2) times a day. Max Daily Amount: 7.5 mg. (Patient taking differently: Take 3.75 mg by mouth as needed for Sleep.), Disp: 36 Tab, Rfl: 2  ???  valACYclovir (VALTREX) 500 mg tablet, Take 1 Tab by mouth as needed. 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  Each section is scored on a 1-5 scale, 5 representing the greatest disability.  The scores of each section are added together for a total score of 55.    Observation/Orthostatic Postural Assessment:      no abnormalities  Palpation:      Surgical site healed, scar proximal of cubital fossa    ROM:   Shoulder ROM  DATE  11.29.16 DATE     flexion R: WNL   L:  WNL  R:   L:    abd R: WNL  L:  WNL R:   L:    External rotation  R:  WFL   L:  WFL R:   L:    Internal rotation R: WFL   L: WFL R:   L:    Elbow flexion  R: 135  L:  135 R:   L:    Elbow extension  R: 0  L: -15 R:   L:     Strength:    DATE  03/15/15 DATE     Shoulder flexion R: 5  L: 4- R:   L:     Shoulder abd R: 5  L: 4 R:   L:    shoulder External rotation  R: 5  L: 4+ R:   L:    shoulder Internal rotation R: 5  L: 4+ R:   L:    Elbow flexion  R: 5  L:  4- R:   L:  Elbow extension  R: 5  L: 4+ R:   L:   forearm Pronation R: 5  L: 4    forearm Supination  R: 5  L: 4-     Wrist flexion  R: 5  L: 4+    Wrist extension  R:5   L: 4+      Special Tests: N/A  Neurological Screen:   Myotomes: normal                  Dermatomes: normal                  Reflexes: not tested                  Neural Tension Tests: not tested   Functional Mobility:      difficulty with lifting and reaching using left UE   Balance:      Good   TREATMENT:    (In addition to Assessment/Re-Assessment sessions the following treatments were rendered)  THERAPEUTIC EXERCISE: (40 minutes):  Exercises per grid below to improve mobility and strength.  Required moderate visual, verbal and manual cues to promote proper body alignment, promote proper body posture, promote proper body mechanics and promote proper body breathing techniques.  Progressed resistance, range and repetitions as indicated.   Date:  03/15/15 Date:  03-17-15 Date:     Activity/Exercise Parameters Parameters Parameters   UBE    6 mins  Level 1  Forward     shoulder flexion  neutral position supine  X 20      Elbow flexion/extension  X 20 6 lb x20 in supination  4 lb x20 in neutral    Wrist supination/pronation  X 20      Rows    17 lb  2x10    Shoulder extension    17 lb  2x10                  Manual Therapy (      ):   ?? OP with left elbow extension and forearm supination and pronation   Therapeutic Modalities: Patient received cold pack to L elbow in sitting, arm supported on mat table, 5 minutes                                                                                              HEP: As above; handouts given to patient for all exercises.  ______________________________________________________________________________________________________     Treatment Assessment:  Patient was able to tolerate gentle load to biceps today, without significant discomfort. He can achieve full elbow extension actively. Will continue to progress as tolerated.       Next treatment appointment scheduled:  Future Appointments  Date Time Provider Department Center   03/22/2015 8:15 AM Eyvonne LeftCARLEY, Alberta Lenhard [facility]   03/24/2015 7:45 AM Karalina Tift [facility]   03/29/2015 7:45 AM Wilber OliphantKathryn A Gebert, PT [facility]   03/31/2015 7:45 AM Wilber OliphantKathryn A Gebert, PT [facility]   04/05/2015 7:45 AM Wilber OliphantKathryn A Gebert, PT [facility]   04/07/2015 7:45 AM Mellody LifeKathryn A  [place], PT [facility]   04/19/2015 7:45 AM Wilber Oliphant, PT [facility]   04/21/2015 7:45 AM Wilber Oliphant, PT [facility]     Progression/Medical Necessity:   ?? Patient is expected to demonstrate progress in strength and range of motion to decrease assistance required with recreational and home activities .  Compliance with Program/Exercises: Will assess as treatment progresses.   Reason for Continuation of Services/Other Comments:  ?? Patient continues to require modification of therapeutic interventions to increase complexity of exercises.  Recommendations/Intent for next treatment session: "Treatment next visit will focus on advancements to more challenging activities". AROM and PROM of left elbow, forearm and shoulder, scar mobilization, ice    Total Treatment Duration:  PT Patient Time In/Time Out  Time In: 1430  Time Out: 1515    Nadeen Landau, PT, DPT

## 2015-03-22 ENCOUNTER — Encounter: Payer: BLUE CROSS/BLUE SHIELD | Primary: Nurse Practitioner

## 2015-03-24 ENCOUNTER — Inpatient Hospital Stay: Admit: 2015-03-24 | Payer: BLUE CROSS/BLUE SHIELD | Primary: Nurse Practitioner

## 2015-03-24 NOTE — Progress Notes (Signed)
Ronald Fields.   (DOB:[birth year]) Therapy Center at   [facility]  [address], [address], [place] [address]  [phone]   [phone]         Outpatient PHYSICAL THERAPY: Daily Note 03/24/2015      Fall Risk Score: 1 (? 5 = High Risk)    ICD-10: Treatment Diagnosis:  Stiffness in joint; left elbow (M25.522), stiffness of joint; left wrist (M25.532), pain in limb, unspecified, arm (M79.602)     REFERRING PHYSICIAN: Germaine Pomfret, MD  MD Orders: evaluate and treat  Return Physician Appointment: in 4 weeks   MEDICAL/REFERRING DIAGNOSIS:(S46.212A) strain of muscle, fascia and tendon of other parts of biceps, left arm   DATE OF ONSET: surgery: 02/04/15    PRIOR LEVEL OF FUNCTION: independent, active  PRECAUTIONS/ALLERGIES: No Known Allergies    ASSESSMENT:  ????????This section established at most recent assessment??????????  Ronald Fields. arrives for PT evaluation 5 weeks s/p left distal bicipital repair. He demonstrates functional limitations due to elbow and forearm joint stiffness and pain.    PROBLEM LIST (Impairments causing functional limitations):  1. Decreased Strength affecting function  2. Decreased ADL/Functional Activities  3. Decreased Flexibility/joint mobility  4. Increased Pain affecting function  5. Other decreased knowledge of HEP  GOALS: (Goals have been discussed and agreed upon with patient.)  SHORT-TERM FUNCTIONAL GOALS: Time Frame: 2-4 weeks   1. Pt will be independent with HEP.  2. Pt will report left UE pain does not exceed 2/10 during functional activities.   3. Pt will improve left elbow extension by 10 degrees or greater to improve functional mobility.    DISCHARGE GOALS: Time Frame: 6-8 week  1. Pt will demonstrate 5/5 strength forearm and elbow strength all planes.  2. Pt will report no limitations from left UE pain or stiffness during recreational and home activities.   3. Pt will demonstrate DASH score improvement by at least 10 points.     REHABILITATION POTENTIAL FOR STATED GOALS: GoodPLAN OF CARE:  INTERVENTIONS PLANNED: (Benefits and precautions of physical therapy have been discussed with the patient.)  1. cold  2. heat  3. home exercise program (HEP)  4. manual therapy  5. neuromuscular re-education/strengthening  6. range of motion: active/assisted/passive  7. therapeutic activities  8. therapeutic exercise/strengthening  TREATMENT PLAN EFFECTIVE DATES: 11.29.16 TO 1.29.17  FREQUENCY/DURATION: Follow patient 2 times a week for 4-8 weeks to address above goals.  Regarding Corrado Hymon Jr.'s therapy, I certify that the treatment plan above will be carried out by a therapist or under their direction.  Thank you for this referral,  Nadeen Landau, PT, DPT     Referring Physician Signature: Germaine Pomfret, MD          Date                       SUBJECTIVE:  03/24/2015   Patient states he is doing well, returning to the gym, but only noticing soreness in the biceps, no significant pain.     History of Present Injury/Illness (Reason for Referral):   Pt states that on 01/28/15 he felt left bicep pain while lifting weights.  He did not alter any activities at that time and played full game of rugby.  Over the next few days, he experienced significant arm pain and elbow restriction.  Went to urgent care and was then referred to orthopedist.  Dx with distal bicep rupture. Bicipital repair performed on 02/04/15.  Pt reports left elbow was immobilized in a splint for 5 weeks. No other rehab prior to today.      Present Symptoms: intermittent moderate pain of left elbow, wrist, shoulder     Aggravating Factors: lifting weight, lifting his children, dressing    Relieving Factors: ibuprofen   Pain level: 1-2/10 presently, 5/10 worst, 1/10 best      Dominant Side: right  Past Medical History:   Past Medical History   Diagnosis Date   ??? Left wrist pain      Current Medications:    Current Outpatient Prescriptions:   ???  NOREL AD 4-10-325 mg tab, TAKE 1 TABLET BY MOUTH TWICE A DAY, Disp: , Rfl: 2  ???  ondansetron (ZOFRAN ODT) 4 mg disintegrating tablet, TAKE 1 TABLET(S) UNDER THE TONGUE EVERY 4-6 HOURS AS NEEDED, Disp: , Rfl: 0  ???  ibuprofen 200 mg cap, Take  by mouth as needed., Disp: , Rfl:   ???  omega-3 fatty acids-vitamin e (FISH OIL) 1,000 mg cap, Take 1 Cap by mouth daily., Disp: , Rfl:   ???  clorazepate (TRANXENE) 3.75 mg tablet, Take 1 Tab by mouth two (2) times a day. Max Daily Amount: 7.5 mg. (Patient taking differently: Take 3.75 mg by mouth as needed for Sleep.), Disp: 36 Tab, Rfl: 2  ???  valACYclovir (VALTREX) 500 mg tablet, Take 1 Tab by mouth as needed. 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  Each section is scored on a 1-5 scale, 5 representing the greatest disability.  The scores of each section are added together for a total score of 55.    Observation/Orthostatic Postural Assessment:      no abnormalities  Palpation:      Surgical site healed, scar proximal of cubital fossa    ROM:   Shoulder ROM  DATE  11.29.16 DATE     flexion R: WNL   L:  WNL  R:   L:    abd R: WNL  L:  WNL R:   L:    External rotation  R:  WFL   L:  WFL R:   L:    Internal rotation R: WFL   L: WFL R:   L:    Elbow flexion  R: 135  L:  135 R:   L:    Elbow extension  R: 0  L: -15 R:   L:     Strength:    DATE  03/15/15 DATE      Shoulder flexion R: 5  L: 4- R:   L:    Shoulder abd R: 5  L: 4 R:   L:    shoulder External rotation  R: 5  L: 4+ R:   L:    shoulder Internal rotation R: 5  L: 4+ R:   L:    Elbow flexion  R: 5  L:  4- R:   L:    Elbow extension  R: 5  L: 4+ R:   L:   forearm Pronation R: 5  L: 4    forearm Supination  R: 5  L: 4-     Wrist flexion  R: 5  L: 4+    Wrist extension  R:5   L: 4+      Special Tests: N/A  Neurological Screen:   Myotomes: normal                  Dermatomes: normal                  Reflexes: not tested                  Neural Tension Tests: not tested   Functional Mobility:      difficulty with lifting and reaching using left UE   Balance:      Good   TREATMENT:    (In addition to Assessment/Re-Assessment sessions the following treatments were rendered)  THERAPEUTIC EXERCISE: (40 minutes):  Exercises per grid below to improve mobility and strength.  Required moderate visual, verbal and manual cues to promote proper body alignment, promote proper body posture, promote proper body mechanics and promote proper body breathing techniques.  Progressed resistance, range and repetitions as indicated.   Date:  03/15/15 Date:  03-17-15 Date:  03-23-16   Activity/Exercise Parameters Parameters Parameters   UBE    6 mins  Level 1  Forward  8 mins  Level 3  Forward    shoulder flexion  neutral position supine  X 20      Elbow flexion/extension  X 20 6 lb x20 in supination  4 lb x20 in neutral 7 lb x20 in supination  5 lb x20 in neutral   Wrist supination/pronation  X 20      Rows    17 lb  2x10 17 lb cable  2x10   Shoulder extension    17 lb  2x10 17 lb cable   2x10   Shoulder ER/IR     7 lb cable IR  x15  3 lb cable ER  x15           Manual Therapy (      ):   ?? OP with left elbow extension and forearm supination and pronation   Therapeutic Modalities: Patient received cold pack to L elbow in sitting, arm supported on mat table, 5 minutes                                                                                               HEP: As above; handouts given to patient for all exercises.  ______________________________________________________________________________________________________    Treatment Assessment:  Patient tolerated increase in resistance today very well, just mild soreness. He is also working independently on his strength and motion, regularly.       Next treatment appointment scheduled:  Future Appointments  Date Time Provider Department Center   03/29/2015 7:45 AM Wilber Oliphant, PT [facility]   03/31/2015 7:45 AM Wilber Oliphant,  PT [facility]   04/05/2015 7:45 AM Wilber OliphantKathryn A Gebert, PT [facility]   04/07/2015 7:45 AM Wilber OliphantKathryn A Gebert, PT [facility]   04/19/2015 7:45 AM Wilber OliphantKathryn A Gebert, PT [facility]   04/21/2015 7:45 AM Wilber OliphantKathryn A Gebert, PT [facility]     Progression/Medical Necessity:   ?? Patient is expected to demonstrate progress in strength and range of motion to decrease assistance required with recreational and home activities .  Compliance with Program/Exercises: Will assess as treatment progresses.   Reason for Continuation of Services/Other Comments:  ?? Patient continues to require modification of therapeutic interventions to increase complexity of exercises.  Recommendations/Intent for next treatment session: "Treatment next visit will focus on advancements to more challenging activities". AROM and PROM of left elbow, forearm and shoulder, scar mobilization, ice    Total Treatment Duration:  PT Patient Time In/Time Out  Time In: 0745  Time Out: 0830    Nadeen LandauScott R Shannara Winbush, PT, DPT

## 2015-03-29 ENCOUNTER — Inpatient Hospital Stay: Admit: 2015-03-29 | Payer: BLUE CROSS/BLUE SHIELD | Primary: Nurse Practitioner

## 2015-03-29 NOTE — Progress Notes (Addendum)
Ronald Fields.   (DOB:October 16, 1975) Therapy Center at   [facility]  [address], [place] [address]  [phone]   Fax:[phone]         Outpatient PHYSICAL THERAPY: Daily Note 03/29/2015      Fall Risk Score: 1 (? 5 = High Risk)    ICD-10: Treatment Diagnosis:  Stiffness in joint; left elbow (M25.522), stiffness of joint; left wrist (M25.532), pain in limb, unspecified, arm (M79.602)     REFERRING PHYSICIAN: Germaine Pomfret, MD  MD Orders: evaluate and treat  Return Physician Appointment: in 4 weeks   MEDICAL/REFERRING DIAGNOSIS:(S46.212A) strain of muscle, fascia and tendon of other parts of biceps, left arm   DATE OF ONSET: surgery: 02/04/15    PRIOR LEVEL OF FUNCTION: independent, active  PRECAUTIONS/ALLERGIES: No Known Allergies    ASSESSMENT:  ????????This section established at most recent assessment??????????  Ronald Fields. arrives for PT evaluation 5 weeks s/p left distal bicipital repair. He demonstrates functional limitations due to elbow and forearm joint stiffness and pain.    PROBLEM LIST (Impairments causing functional limitations):  1. Decreased Strength affecting function  2. Decreased ADL/Functional Activities  3. Decreased Flexibility/joint mobility  4. Increased Pain affecting function  5. Other decreased knowledge of HEP  GOALS: (Goals have been discussed and agreed upon with patient.)  SHORT-TERM FUNCTIONAL GOALS: Time Frame: 2-4 weeks   1. Pt will be independent with HEP.  2. Pt will report left UE pain does not exceed 2/10 during functional activities.   3. Pt will improve left elbow extension by 10 degrees or greater to improve functional mobility.    DISCHARGE GOALS: Time Frame: 6-8 week  1. Pt will demonstrate 5/5 strength forearm and elbow strength all planes.  2. Pt will report no limitations from left UE pain or stiffness during recreational and home activities.   3. Pt will demonstrate DASH score improvement by at least 10 points.     REHABILITATION POTENTIAL FOR STATED GOALS: GoodPLAN OF CARE:  INTERVENTIONS PLANNED: (Benefits and precautions of physical therapy have been discussed with the patient.)  1. cold  2. heat  3. home exercise program (HEP)  4. manual therapy  5. neuromuscular re-education/strengthening  6. range of motion: active/assisted/passive  7. therapeutic activities  8. therapeutic exercise/strengthening  TREATMENT PLAN EFFECTIVE DATES: 11.29.16 TO 1.29.17  FREQUENCY/DURATION: Follow patient 2 times a week for 4-8 weeks to address above goals.  Regarding Sholom Dulude Jr.'s therapy, I certify that the treatment plan above will be carried out by a therapist or under their direction.  Thank you for this referral,  Wilber Oliphant, PT     Referring Physician Signature: Germaine Pomfret, MD          Date                       SUBJECTIVE:  03/29/2015   Pt reports that he has been experiencing increased pain of wrist extensors as well as supinator muscle region. Reports that he has been going to the gym frequently and tried a class that involved punching moves. He states increased pain after that activity.  I encrouraged pt to avoid quick, jerky, movements as much as possible until rehab progresses.    Tingling of left thumb also intermittently present over last few days.     History of Present Injury/Illness (Reason for Referral):   Pt states that on 01/28/15 he felt left bicep pain while lifting weights.  He did not alter any activities at that time and played full game of rugby.  Over the next few days, he experienced significant arm pain and elbow restriction.  Went to urgent care and was then referred to orthopedist.  Dx with distal bicep rupture. Bicipital repair performed on 02/04/15. Pt reports left elbow was immobilized in a splint for 5 weeks. No other rehab prior to today.      Present Symptoms: intermittent moderate pain of left elbow, wrist,  shoulder     Aggravating Factors: lifting weight, lifting his children, dressing    Relieving Factors: ibuprofen   Pain level: 1-2/10 presently, 5/10 worst, 1/10 best      Dominant Side: right  Past Medical History:   Past Medical History   Diagnosis Date   ??? Left wrist pain      Current Medications:   Current Outpatient Prescriptions:   ???  NOREL AD 4-10-325 mg tab, TAKE 1 TABLET BY MOUTH TWICE A DAY, Disp: , Rfl: 2  ???  ondansetron (ZOFRAN ODT) 4 mg disintegrating tablet, TAKE 1 TABLET(S) UNDER THE TONGUE EVERY 4-6 HOURS AS NEEDED, Disp: , Rfl: 0  ???  ibuprofen 200 mg cap, Take  by mouth as needed., Disp: , Rfl:   ???  omega-3 fatty acids-vitamin e (FISH OIL) 1,000 mg cap, Take 1 Cap by mouth daily., Disp: , Rfl:   ???  clorazepate (TRANXENE) 3.75 mg tablet, Take 1 Tab by mouth two (2) times a day. Max Daily Amount: 7.5 mg. (Patient taking differently: Take 3.75 mg by mouth as needed for Sleep.), Disp: 36 Tab, Rfl: 2  ???  valACYclovir (VALTREX) 500 mg tablet, Take 1 Tab by mouth as needed. 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  Each section is scored on a 1-5 scale, 5 representing the greatest disability.  The scores of each section are added together for a total score of 55.    Observation/Orthostatic Postural Assessment:      no abnormalities  Palpation:      Surgical site healed, scar proximal of cubital fossa    ROM:   Shoulder ROM  DATE   11.29.16 DATE     flexion R: WNL   L:  WNL  R:   L:    abd R: WNL  L:  WNL R:   L:    External rotation  R:  WFL   L:  WFL R:   L:    Internal rotation R: WFL   L: WFL R:   L:    Elbow flexion  R: 135  L:  135 R:   L:    Elbow extension  R: 0  L: -15 R:   L:     Strength:    DATE  03/15/15 DATE     Shoulder flexion R: 5  L: 4- R:   L:    Shoulder  abd R: 5  L: 4 R:   L:    shoulder External rotation  R: 5  L: 4+ R:   L:    shoulder Internal rotation R: 5  L: 4+ R:   L:    Elbow flexion  R: 5  L:  4- R:   L:    Elbow extension  R: 5  L: 4+ R:   L:   forearm Pronation R: 5  L: 4    forearm Supination  R: 5  L: 4-     Wrist flexion  R: 5  L: 4+    Wrist extension  R:5   L: 4+      Special Tests: N/A  Neurological Screen:   Myotomes: normal                  Dermatomes: normal                  Reflexes: not tested                  Neural Tension Tests: not tested   Functional Mobility:      difficulty with lifting and reaching using left UE   Balance:      Good   TREATMENT:    (In addition to Assessment/Re-Assessment sessions the following treatments were rendered)  THERAPEUTIC EXERCISE: (8 minutes):  Exercises per grid below to improve mobility and strength.  Required moderate visual, verbal and manual cues to promote proper body alignment, promote proper body posture, promote proper body mechanics and promote proper body breathing techniques.  Progressed resistance, range and repetitions as indicated.   Date:  03/15/15 Date:  03-17-15 Date:  03-23-16 03/29/15   Activity/Exercise Parameters Parameters Parameters    UBE    6 mins  Level 1  Forward  8 mins  Level 3  Forward  6 min level 3 fwd    shoulder flexion  neutral position supine  X 20       Elbow flexion/extension  X 20 6 lb x20 in supination  4 lb x20 in neutral 7 lb x20 in supination  5 lb x20 in neutral    Wrist supination/pronation  X 20       Rows    17 lb  2x10 17 lb cable  2x10    Shoulder extension    17 lb  2x10 17 lb cable   2x10    Shoulder ER/IR      7 lb cable IR  x15  3 lb cable ER  x15    Wrist extensor stretching     Demo do     Manual Therapy (     ):   ?? Trigger point release to wrist extensors and forearm supinator musculature.   ?? Wrist extensor stretching   ?? PA's and AP's of prox radial head to decrease striffness  Therapeutic Modalities: x 10 min cold back to left forearm.  HEP: As above; handouts given to patient for all exercises.  ______________________________________________________________________________________________________    Treatment Assessment:  Much improved elbow extension since last week. Trigger points noted of forearm musculature, decreased with manual techniques. No significant pain at end of session.     Next treatment appointment scheduled:  Future Appointments  Date Time Provider Department Center   03/31/2015 7:45 AM Wilber OliphantKathryn A Demario Faniel, PT [facility]   04/05/2015 7:45 AM Wilber OliphantKathryn A Brenen Beigel, PT [facility]   04/07/2015 7:45 AM Wilber OliphantKathryn A Eymi Lipuma, PT [facility]   04/19/2015 7:45 AM Wilber OliphantKathryn A Katarzyna Wolven, PT [facility]   04/21/2015 7:45 AM Wilber OliphantKathryn A Kaylinn Dedic, PT [facility]     Progression/Medical Necessity:   ?? Patient is expected to demonstrate progress in strength and range of motion to decrease assistance required with recreational and home activities .  Compliance with Program/Exercises: Will assess as treatment progresses.   Reason for Continuation of Services/Other Comments:  ?? Patient continues to require modification of therapeutic interventions to increase complexity of exercises.  Recommendations/Intent for next treatment session: "Treatment next visit will focus on advancements to more challenging activities". AROM and PROM of left elbow, forearm and shoulder, scar mobilization, ice    Total Treatment Duration:  PT Patient Time In/Time Out  Time In: 0745  Time Out: 0830    Wilber OliphantKathryn A Indea Dearman, PT

## 2015-03-31 ENCOUNTER — Inpatient Hospital Stay: Admit: 2015-03-31 | Payer: BLUE CROSS/BLUE SHIELD | Primary: Nurse Practitioner

## 2015-03-31 NOTE — Progress Notes (Signed)
Ronald Fields.   (DOB:September 26, 1975) Therapy Center at   [facility]  [address], [place] [address]  Phone:[phone]   [phone]         Outpatient PHYSICAL THERAPY: Daily Note 03/31/2015      Fall Risk Score: 1 (? 5 = High Risk)    ICD-10: Treatment Diagnosis:  Stiffness in joint; left elbow (M25.522), stiffness of joint; left wrist (M25.532), pain in limb, unspecified, arm (M79.602)     REFERRING PHYSICIAN: Germaine Pomfret, MD  MD Orders: evaluate and treat  Return Physician Appointment: in 4 weeks   MEDICAL/REFERRING DIAGNOSIS:(S46.212A) strain of muscle, fascia and tendon of other parts of biceps, left arm   DATE OF ONSET: surgery: 02/04/15    PRIOR LEVEL OF FUNCTION: independent, active  PRECAUTIONS/ALLERGIES: No Known Allergies    ASSESSMENT:  ????????This section established at most recent assessment??????????  Ronald Fields. arrives for PT evaluation 5 weeks s/p left distal bicipital repair. He demonstrates functional limitations due to elbow and forearm joint stiffness and pain.    PROBLEM LIST (Impairments causing functional limitations):  1. Decreased Strength affecting function  2. Decreased ADL/Functional Activities  3. Decreased Flexibility/joint mobility  4. Increased Pain affecting function  5. Other decreased knowledge of HEP  GOALS: (Goals have been discussed and agreed upon with patient.)  SHORT-TERM FUNCTIONAL GOALS: Time Frame: 2-4 weeks   1. Pt will be independent with HEP.  2. Pt will report left UE pain does not exceed 2/10 during functional activities.   3. Pt will improve left elbow extension by 10 degrees or greater to improve functional mobility.    DISCHARGE GOALS: Time Frame: 6-8 week  1. Pt will demonstrate 5/5 strength forearm and elbow strength all planes.  2. Pt will report no limitations from left UE pain or stiffness during recreational and home activities.   3. Pt will demonstrate DASH score improvement by at least 10 points.     REHABILITATION POTENTIAL FOR STATED GOALS: GoodPLAN OF CARE:  INTERVENTIONS PLANNED: (Benefits and precautions of physical therapy have been discussed with the patient.)  1. cold  2. heat  3. home exercise program (HEP)  4. manual therapy  5. neuromuscular re-education/strengthening  6. range of motion: active/assisted/passive  7. therapeutic activities  8. therapeutic exercise/strengthening  TREATMENT PLAN EFFECTIVE DATES: 11.29.16 TO 1.29.17  FREQUENCY/DURATION: Follow patient 2 times a week for 4-8 weeks to address above goals.  Regarding Avaneesh Pepitone Jr.'s therapy, I certify that the treatment plan above will be carried out by a therapist or under their direction.  Thank you for this referral,  Wilber Oliphant, PT     Referring Physician Signature: Germaine Pomfret, MD          Date                       SUBJECTIVE:  03/31/2015   Pt states that the manual treatment last session really helped reduce the forearm pain he had been experiencing. Pt also states that his tingling in his thumb has been gone since last visit. No significant pain today, just feels like his left bicep is extremely fatigued.      History of Present Injury/Illness (Reason for Referral):   Pt states that on 01/28/15 he felt left bicep pain while lifting weights.  He did not alter any activities at that time and played full game of rugby.  Over the next few days, he experienced significant arm  pain and elbow restriction.  Went to urgent care and was then referred to orthopedist.  Dx with distal bicep rupture. Bicipital repair performed on 02/04/15. Pt reports left elbow was immobilized in a splint for 5 weeks. No other rehab prior to today.      Present Symptoms: intermittent moderate pain of left elbow, wrist, shoulder     Aggravating Factors: lifting weight, lifting his children, dressing    Relieving Factors: ibuprofen    Pain level: 1-2/10 presently, 5/10 worst, 1/10 best      Dominant Side: right  Past Medical History:   Past Medical History   Diagnosis Date   ??? Left wrist pain      Current Medications:   Current Outpatient Prescriptions:   ???  NOREL AD 4-10-325 mg tab, TAKE 1 TABLET BY MOUTH TWICE A DAY, Disp: , Rfl: 2  ???  ondansetron (ZOFRAN ODT) 4 mg disintegrating tablet, TAKE 1 TABLET(S) UNDER THE TONGUE EVERY 4-6 HOURS AS NEEDED, Disp: , Rfl: 0  ???  ibuprofen 200 mg cap, Take  by mouth as needed., Disp: , Rfl:   ???  omega-3 fatty acids-vitamin e (FISH OIL) 1,000 mg cap, Take 1 Cap by mouth daily., Disp: , Rfl:   ???  clorazepate (TRANXENE) 3.75 mg tablet, Take 1 Tab by mouth two (2) times a day. Max Daily Amount: 7.5 mg. (Patient taking differently: Take 3.75 mg by mouth as needed for Sleep.), Disp: 36 Tab, Rfl: 2  ???  valACYclovir (VALTREX) 500 mg tablet, Take 1 Tab by mouth as needed. 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  Each section is scored on a 1-5 scale, 5 representing the greatest disability.  The scores of each section are added together for a total score of 55.    Observation/Orthostatic Postural Assessment:      no abnormalities  Palpation:      Surgical site healed, scar proximal of cubital fossa    ROM:   Shoulder ROM  DATE  11.29.16 DATE     flexion R: WNL   L:  WNL  R:   L:    abd R: WNL  L:  WNL R:   L:    External rotation  R:  WFL    L:  WFL R:   L:    Internal rotation R: WFL   L: WFL R:   L:    Elbow flexion  R: 135  L:  135 R:   L:    Elbow extension  R: 0  L: -15 R:   L:     Strength:    DATE  03/15/15 DATE     Shoulder flexion R: 5  L: 4- R:   L:    Shoulder abd R: 5  L: 4 R:   L:    shoulder External rotation  R: 5  L: 4+ R:  L:    shoulder Internal rotation R: 5  L: 4+ R:   L:    Elbow flexion  R: 5  L:  4- R:   L:    Elbow extension  R: 5  L: 4+ R:   L:   forearm Pronation R: 5  L: 4    forearm Supination  R: 5  L: 4-     Wrist flexion  R: 5  L: 4+    Wrist extension  R:5   L: 4+      Special Tests: N/A  Neurological Screen:   Myotomes: normal                  Dermatomes: normal                  Reflexes: not tested                  Neural Tension Tests: not tested   Functional Mobility:      difficulty with lifting and reaching using left UE   Balance:      Good   TREATMENT:    (In addition to Assessment/Re-Assessment sessions the following treatments were rendered)  THERAPEUTIC EXERCISE: (30 minutes):  Exercises per grid below to improve mobility and strength.  Required moderate visual, verbal and manual cues to promote proper body alignment, promote proper body posture, promote proper body mechanics and promote proper body breathing techniques.  Progressed resistance, range and repetitions as indicated.   Date:  03/15/15 Date:  03-17-15 Date:  03-23-16 03/29/15 03/31/15   Activity/Exercise Parameters Parameters Parameters     UBE    6 mins  Level 1  Forward  8 mins  Level 3  Forward  6 min level 3 fwd  6 min level 3    Elbow flexion/extension  X 20 6 lb x20 in supination  4 lb x20 in neutral 7 lb x20 in supination  5 lb x20 in neutral     Wrist supination/pronation  X 20        Rows    17 lb  2x10 17 lb cable  2x10  13# cable 2 x 10    Shoulder extension    17 lb  2x10 17 lb cable   2x10  13# cable 2 x 10    Shoulder ER/IR     7 lb cable IR  x15  3 lb cable ER  x15     Wrist extensor stretching     Demo do     Shoulder flexion     3# 2 x 10    shoulder abd      3 # 2 x 10    Shoulder scaption      3# x 10 (above shoulder)   Elbow flexion      Nuetral, pronated, supinated x 10 each, 3#      Manual Therapy (     ):   ?? Trigger point release to wrist extensors and forearm supinator musculature.   ?? Wrist extensor stretching   ?? PA's and AP's of prox radial head to decrease striffness  Therapeutic Modalities: x 10 min cold back to left elbow region .  HEP: As above; handouts given to patient for all exercises.  ______________________________________________________________________________________________________    Treatment Assessment: fatigue of left arm with most exercises today.  Pt states that he is pain free at end of session.      Next treatment appointment scheduled:  Future Appointments  Date Time Provider Department Center   04/05/2015 7:45 AM Wilber OliphantKathryn A Ianmichael Amescua, PT [facility]   04/07/2015 7:45 AM Wilber OliphantKathryn A Otha Monical, PT [facility]   04/19/2015 7:45 AM Wilber OliphantKathryn A Byrne Capek, PT [facility]   04/21/2015 7:45 AM Wilber OliphantKathryn A Izumi Mixon, PT [facility]     Progression/Medical Necessity:   ?? Patient is expected to demonstrate progress in strength and range of motion to decrease assistance required with recreational and home activities .  Compliance with Program/Exercises: Will assess as treatment progresses.   Reason for Continuation of Services/Other Comments:  ?? Patient continues to require modification of therapeutic interventions to increase complexity of exercises.  Recommendations/Intent for next treatment session: "Treatment next visit will focus on advancements to more challenging activities". AROM and PROM of left elbow, forearm and shoulder, scar mobilization, ice    Total Treatment Duration:  PT Patient Time In/Time Out  Time In: 0745  Time Out: 0830    Wilber OliphantKathryn A Kristi Norment, PT

## 2015-04-05 ENCOUNTER — Inpatient Hospital Stay: Admit: 2015-04-05 | Payer: BLUE CROSS/BLUE SHIELD | Primary: Nurse Practitioner

## 2015-04-05 NOTE — Progress Notes (Signed)
Ronald Fields.   (DOB:[birth year]) Therapy Center at   [facility]  [address], [address], [place] [address]  Phone:[phone]   Fax:[phone]         Outpatient PHYSICAL THERAPY: Daily Note 04/05/2015      Fall Risk Score: 1 (? 5 = High Risk)    ICD-10: Treatment Diagnosis:  Stiffness in joint; left elbow (M25.522), stiffness of joint; left wrist (M25.532), pain in limb, unspecified, arm (M79.602)     REFERRING PHYSICIAN: Germaine Pomfret, MD  MD Orders: evaluate and treat  Return Physician Appointment: in 4 weeks   MEDICAL/REFERRING DIAGNOSIS:(S46.212A) strain of muscle, fascia and tendon of other parts of biceps, left arm   DATE OF ONSET: surgery: 02/04/15    PRIOR LEVEL OF FUNCTION: independent, active  PRECAUTIONS/ALLERGIES: No Known Allergies    ASSESSMENT:  ????????This section established at most recent assessment??????????  Ronald Fields. arrives for PT evaluation 5 weeks s/p left distal bicipital repair. He demonstrates functional limitations due to elbow and forearm joint stiffness and pain.    PROBLEM LIST (Impairments causing functional limitations):  1. Decreased Strength affecting function  2. Decreased ADL/Functional Activities  3. Decreased Flexibility/joint mobility  4. Increased Pain affecting function  5. Other decreased knowledge of HEP  GOALS: (Goals have been discussed and agreed upon with patient.)  SHORT-TERM FUNCTIONAL GOALS: Time Frame: 2-4 weeks   1. Pt will be independent with HEP.  2. Pt will report left UE pain does not exceed 2/10 during functional activities.   3. Pt will improve left elbow extension by 10 degrees or greater to improve functional mobility.    DISCHARGE GOALS: Time Frame: 6-8 week  1. Pt will demonstrate 5/5 strength forearm and elbow strength all planes.  2. Pt will report no limitations from left UE pain or stiffness during recreational and home activities.   3. Pt will demonstrate DASH score improvement by at least 10 points.     REHABILITATION POTENTIAL FOR STATED GOALS: GoodPLAN OF CARE:  INTERVENTIONS PLANNED: (Benefits and precautions of physical therapy have been discussed with the patient.)  1. cold  2. heat  3. home exercise program (HEP)  4. manual therapy  5. neuromuscular re-education/strengthening  6. range of motion: active/assisted/passive  7. therapeutic activities  8. therapeutic exercise/strengthening  TREATMENT PLAN EFFECTIVE DATES: 11.29.16 TO 1.29.17  FREQUENCY/DURATION: Follow patient 2 times a week for 4-8 weeks to address above goals.  Regarding Ronald Razon Jr.'s therapy, I certify that the treatment plan above will be carried out by a therapist or under their direction.  Thank you for this referral,  Wilber Oliphant, PT     Referring Physician Signature: Ronald Pomfret, MD          Date                       SUBJECTIVE:  04/05/2015    pt states that he is doing well with his exercises at home.  Pain min to moderate over the last few days. Pt states that he is trying to avoid most jerky and quick bicep movements as advised.      History of Present Injury/Illness (Reason for Referral):   Pt states that on 01/28/15 he felt left bicep pain while lifting weights.  He did not alter any activities at that time and played full game of rugby.  Over the next few days, he experienced significant arm pain and elbow restriction.  Went to  urgent care and was then referred to orthopedist.  Dx with distal bicep rupture. Bicipital repair performed on 02/04/15. Pt reports left elbow was immobilized in a splint for 5 weeks. No other rehab prior to today.      Present Symptoms: intermittent moderate pain of left elbow, wrist, shoulder     Aggravating Factors: lifting weight, lifting his children, dressing    Relieving Factors: ibuprofen   Pain level: 1-2/10 presently, 5/10 worst, 1/10 best      Dominant Side: right  Past Medical History:    Past Medical History   Diagnosis Date   ??? Left wrist pain      Current Medications:   Current Outpatient Prescriptions:   ???  NOREL AD 4-10-325 mg tab, TAKE 1 TABLET BY MOUTH TWICE A DAY, Disp: , Rfl: 2  ???  ondansetron (ZOFRAN ODT) 4 mg disintegrating tablet, TAKE 1 TABLET(S) UNDER THE TONGUE EVERY 4-6 HOURS AS NEEDED, Disp: , Rfl: 0  ???  ibuprofen 200 mg cap, Take  by mouth as needed., Disp: , Rfl:   ???  omega-3 fatty acids-vitamin e (FISH OIL) 1,000 mg cap, Take 1 Cap by mouth daily., Disp: , Rfl:   ???  clorazepate (TRANXENE) 3.75 mg tablet, Take 1 Tab by mouth two (2) times a day. Max Daily Amount: 7.5 mg. (Patient taking differently: Take 3.75 mg by mouth as needed for Sleep.), Disp: 36 Tab, Rfl: 2  ???  valACYclovir (VALTREX) 500 mg tablet, Take 1 Tab by mouth as needed. 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  Each section is scored on a 1-5 scale, 5 representing the greatest disability.  The scores of each section are added together for a total score of 55.    Observation/Orthostatic Postural Assessment:      no abnormalities  Palpation:      Surgical site healed, scar proximal of cubital fossa    ROM:   Shoulder ROM  DATE  11.29.16 DATE     flexion R: WNL   L:  WNL  R:   L:    abd R: WNL  L:  WNL R:   L:    External rotation  R:  WFL   L:  WFL R:   L:    Internal rotation R: WFL   L: WFL R:   L:    Elbow flexion  R: 135  L:  135 R:   L:     Elbow extension  R: 0  L: -15 R:   L:     Strength:    DATE  03/15/15 DATE     Shoulder flexion R: 5  L: 4- R:   L:    Shoulder abd R: 5  L: 4 R:   L:    shoulder External rotation  R: 5  L: 4+ R:   L:    shoulder Internal rotation  R: 5  L: 4+ R:   L:    Elbow flexion  R: 5  L:  4- R:   L:    Elbow extension  R: 5  L: 4+ R:   L:   forearm Pronation R: 5  L: 4    forearm Supination  R: 5  L: 4-     Wrist flexion  R: 5  L: 4+    Wrist extension  R:5   L: 4+      Special Tests: N/A  Neurological Screen:   Myotomes: normal                  Dermatomes: normal                  Reflexes: not tested                  Neural Tension Tests: not tested   Functional Mobility:      difficulty with lifting and reaching using left UE   Balance:      Good   TREATMENT:    (In addition to Assessment/Re-Assessment sessions the following treatments were rendered)  THERAPEUTIC EXERCISE: (30 minutes):  Exercises per grid below to improve mobility and strength.  Required moderate visual, verbal and manual cues to promote proper body alignment, promote proper body posture, promote proper body mechanics and promote proper body breathing techniques.  Progressed resistance, range and repetitions as indicated.   Date:  03/15/15 Date:  03-17-15 Date:  03-23-16 03/29/15 03/31/15 04/05/15   Activity/Exercise Parameters Parameters Parameters      UBE    6 mins  Level 1  Forward  8 mins  Level 3  Forward  6 min level 3 fwd  6 min level 3  4/4 min level 3    Elbow flexion/extension  X 20 6 lb x20 in supination  4 lb x20 in neutral 7 lb x20 in supination  5 lb x20 in neutral      Wrist supination/pronation  X 20         Rows    17 lb  2x10 17 lb cable  2x10  13# cable 2 x 10  13# cable 2 x 10    Shoulder extension    17 lb  2x10 17 lb cable   2x10  13# cable 2 x 10     Shoulder ER/IR     7 lb cable IR  x15  3 lb cable ER  x15      Wrist extensor stretching     Demo do     Shoulder flexion     3# 2 x 10  3# x 10     shoulder abd      3 # 2 x 10  3# x 10    Shoulder scaption      3# x 10 (above shoulder) 3# x 10    Elbow flexion      Nuetral, pronated, supinated x 10 each, 3#  Nuetral, pronated, supinated x 10 each, 3#      Manual Therapy (   15 min   ):   ?? Trigger point release to wrist extensors and forearm supinator musculature.   ?? Wrist extensor stretching   ?? PA's and AP's of prox radial head to decrease striffness  Therapeutic Modalities: x 10 min cold back to left elbow region .  HEP: As above; handouts given to patient for all exercises.  ______________________________________________________________________________________________________    Treatment Assessment:   trigger points noted of posterior left forearm. No pain at end of session     Next treatment appointment scheduled:  Future Appointments  Date Time Provider Department Center   04/07/2015 7:45 AM Wilber Oliphant, PT [facility]   04/19/2015 7:45 AM Wilber Oliphant, PT [facility]   04/21/2015 7:45 AM Wilber Oliphant, PT [facility]     Progression/Medical Necessity:   ?? Patient is expected to demonstrate progress in strength and range of motion to decrease assistance required with recreational and home activities .  Compliance with Program/Exercises: Will assess as treatment progresses.   Reason for Continuation of Services/Other Comments:  ?? Patient continues to require modification of therapeutic interventions to increase complexity of exercises.  Recommendations/Intent for next treatment session: "Treatment next visit will focus on advancements to more challenging activities". AROM and PROM of left elbow, forearm and shoulder, scar mobilization, ice    Total Treatment Duration:  PT Patient Time In/Time Out  Time In: 0745  Time Out: 0840    Wilber Oliphant, PT

## 2015-04-07 ENCOUNTER — Inpatient Hospital Stay: Admit: 2015-04-07 | Payer: BLUE CROSS/BLUE SHIELD | Primary: Nurse Practitioner

## 2015-04-07 NOTE — Progress Notes (Signed)
Ronald Fields.   (DOB:[birth year]) Therapy Center at   [facility]  [address], [place] [address]  Phone:[phone]   Fax:[phone]         Outpatient PHYSICAL THERAPY: Daily Note 04/07/2015      Fall Risk Score: 1 (? 5 = High Risk)    ICD-10: Treatment Diagnosis:  Stiffness in joint; left elbow (M25.522), stiffness of joint; left wrist (M25.532), pain in limb, unspecified, arm (M79.602)     REFERRING PHYSICIAN: Germaine Pomfret, MD  MD Orders: evaluate and treat  Return Physician Appointment: in 4 weeks   MEDICAL/REFERRING DIAGNOSIS:(S46.212A) strain of muscle, fascia and tendon of other parts of biceps, left arm   DATE OF ONSET: surgery: 02/04/15    PRIOR LEVEL OF FUNCTION: independent, active  PRECAUTIONS/ALLERGIES: No Known Allergies    ASSESSMENT:  ????????This section established at most recent assessment??????????  Ronald Fields. arrives for PT evaluation 5 weeks s/p left distal bicipital repair. He demonstrates functional limitations due to elbow and forearm joint stiffness and pain.    PROBLEM LIST (Impairments causing functional limitations):  1. Decreased Strength affecting function  2. Decreased ADL/Functional Activities  3. Decreased Flexibility/joint mobility  4. Increased Pain affecting function  5. Other decreased knowledge of HEP  GOALS: (Goals have been discussed and agreed upon with patient.)  SHORT-TERM FUNCTIONAL GOALS: Time Frame: 2-4 weeks   1. Pt will be independent with HEP.  2. Pt will report left UE pain does not exceed 2/10 during functional activities.   3. Pt will improve left elbow extension by 10 degrees or greater to improve functional mobility.    DISCHARGE GOALS: Time Frame: 6-8 week  1. Pt will demonstrate 5/5 strength forearm and elbow strength all planes.  2. Pt will report no limitations from left UE pain or stiffness during recreational and home activities.   3. Pt will demonstrate DASH score improvement by at least 10 points.     REHABILITATION POTENTIAL FOR STATED GOALS: GoodPLAN OF CARE:  INTERVENTIONS PLANNED: (Benefits and precautions of physical therapy have been discussed with the patient.)  1. cold  2. heat  3. home exercise program (HEP)  4. manual therapy  5. neuromuscular re-education/strengthening  6. range of motion: active/assisted/passive  7. therapeutic activities  8. therapeutic exercise/strengthening  TREATMENT PLAN EFFECTIVE DATES: 11.29.16 TO 1.29.17  FREQUENCY/DURATION: Follow patient 2 times a week for 4-8 weeks to address above goals.  Regarding Ronald Cumbie Jr.'s therapy, I certify that the treatment plan above will be carried out by a therapist or under their direction.  Thank you for this referral,  Wilber Oliphant, PT     Referring Physician Signature: Germaine Pomfret, MD          Date                       SUBJECTIVE:  04/07/2015    pt states that he is doing well with his exercises at home.  Pain min over the last few days.    History of Present Injury/Illness (Reason for Referral):   Pt states that on 01/28/15 he felt left bicep pain while lifting weights.  He did not alter any activities at that time and played full game of rugby.  Over the next few days, he experienced significant arm pain and elbow restriction.  Went to urgent care and was then referred to orthopedist.  Dx with distal bicep rupture. Bicipital repair performed on 02/04/15. Pt  reports left elbow was immobilized in a splint for 5 weeks. No other rehab prior to today.      Present Symptoms: intermittent moderate pain of left elbow, wrist, shoulder     Aggravating Factors: lifting weight, lifting his children, dressing    Relieving Factors: ibuprofen   Pain level: 1-2/10 presently, 5/10 worst, 1/10 best      Dominant Side: right  Past Medical History:   Past Medical History   Diagnosis Date   ??? Left wrist pain      Current Medications:    Current Outpatient Prescriptions:   ???  NOREL AD 4-10-325 mg tab, TAKE 1 TABLET BY MOUTH TWICE A DAY, Disp: , Rfl: 2  ???  ondansetron (ZOFRAN ODT) 4 mg disintegrating tablet, TAKE 1 TABLET(S) UNDER THE TONGUE EVERY 4-6 HOURS AS NEEDED, Disp: , Rfl: 0  ???  ibuprofen 200 mg cap, Take  by mouth as needed., Disp: , Rfl:   ???  omega-3 fatty acids-vitamin e (FISH OIL) 1,000 mg cap, Take 1 Cap by mouth daily., Disp: , Rfl:   ???  clorazepate (TRANXENE) 3.75 mg tablet, Take 1 Tab by mouth two (2) times a day. Max Daily Amount: 7.5 mg. (Patient taking differently: Take 3.75 mg by mouth as needed for Sleep.), Disp: 36 Tab, Rfl: 2  ???  valACYclovir (VALTREX) 500 mg tablet, Take 1 Tab by mouth as needed. 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  Each section is scored on a 1-5 scale, 5 representing the greatest disability.  The scores of each section are added together for a total score of 55.    Observation/Orthostatic Postural Assessment:      no abnormalities  Palpation:      Surgical site healed, scar proximal of cubital fossa    ROM:   Shoulder ROM  DATE  11.29.16 DATE     flexion R: WNL   L:  WNL  R:   L:    abd R: WNL  L:  WNL R:   L:    External rotation  R:  WFL   L:  WFL R:   L:    Internal rotation R: WFL   L: WFL R:   L:    Elbow flexion  R: 135  L:  135 R:   L:    Elbow extension  R: 0  L: -15 R:   L:     Strength:    DATE  03/15/15 DATE      Shoulder flexion R: 5  L: 4- R:   L:    Shoulder abd R: 5  L: 4 R:   L:    shoulder External rotation  R: 5  L: 4+ R:   L:    shoulder Internal rotation R: 5  L: 4+ R:   L:    Elbow flexion  R: 5  L:  4- R:   L:    Elbow extension  R: 5  L: 4+ R:   L:   forearm Pronation R: 5  L: 4    forearm Supination  R: 5  L: 4-     Wrist flexion  R: 5  L: 4+    Wrist extension  R:5   L: 4+      Special Tests: N/A  Neurological Screen:   Myotomes: normal                  Dermatomes: normal                  Reflexes: not tested                  Neural Tension Tests: not tested   Functional Mobility:      difficulty with lifting and reaching using left UE   Balance:      Good   TREATMENT:    (In addition to Assessment/Re-Assessment sessions the following treatments were rendered)  THERAPEUTIC EXERCISE: (30 minutes):  Exercises per grid below to improve mobility and strength.  Required moderate visual, verbal and manual cues to promote proper body alignment, promote proper body posture, promote proper body mechanics and promote proper body breathing techniques.  Progressed resistance, range and repetitions as indicated.   Date:  03/15/15 Date:  03-17-15 Date:  03-23-16 03/29/15 03/31/15 04/05/15 04/07/15   Activity/Exercise Parameters Parameters Parameters       UBE    6 mins  Level 1  Forward  8 mins  Level 3  Forward  6 min level 3 fwd  6 min level 3  4/4 min level 3  4/4 min level 5   Wrist extension/flex       3# 2 x 10              Wrist supination/pronation  X 20       3# 2 x 10    Rows    17 lb  2x10 17 lb cable  2x10  13# cable 2 x 10  13# cable 2 x 10  17# cable 2 x 10    Shoulder extension    17 lb  2x10 17 lb cable   2x10  13# cable 2 x 10      Shoulder ER/IR     7 lb cable IR  x15  3 lb cable ER  x15       Wrist extensor stretching     Demo do      Shoulder flexion     3# 2 x 10  3# x 10     shoulder abd      3 # 2 x 10  3# x 10     Shoulder scaption      3# x 10 (above shoulder) 3# x 10      Elbow flexion      Nuetral, pronated, supinated x 10 each, 3#  Nuetral, pronated, supinated x 10 each, 3#     tricep pull down        Cables x 7# each 2 x 10                Manual Therapy (   15 min   ):   ?? Trigger point release to wrist extensors and forearm supinator musculature.   ?? Wrist extensor stretching   ?? PA's and AP's of prox radial head to decrease striffness  Therapeutic Modalities: x 10 min cold back to left elbow region .                                                                                               HEP: As above; handouts given to patient for all exercises.  ______________________________________________________________________________________________________    Treatment Assessment:  No pain at end of session     Next treatment appointment scheduled:  Future Appointments  Date Time Provider Department Center   04/19/2015 7:45 AM Wilber OliphantKathryn A Caraline Deutschman, PT [facility]   04/21/2015 7:45 AM Wilber OliphantKathryn A Naureen Benton, PT [facility]     Progression/Medical Necessity:   ?? Patient is expected to demonstrate progress in strength and range of motion to decrease assistance required with recreational and home activities .  Compliance with Program/Exercises: Will assess as treatment progresses.   Reason for Continuation of Services/Other Comments:  ?? Patient continues to require modification of therapeutic interventions to increase complexity of exercises.  Recommendations/Intent for next treatment session: "Treatment next visit will focus on advancements to more challenging activities". AROM and PROM of left elbow, forearm and shoulder, scar mobilization, ice    Total Treatment Duration:  PT Patient Time In/Time Out  Time In: 0745  Time Out: 0835    Wilber OliphantKathryn A Karalyne Nusser, PT

## 2015-04-19 ENCOUNTER — Inpatient Hospital Stay: Admit: 2015-04-19 | Payer: BLUE CROSS/BLUE SHIELD | Primary: Nurse Practitioner

## 2015-04-19 DIAGNOSIS — M79602 Pain in left arm: Secondary | ICD-10-CM

## 2015-04-19 NOTE — Progress Notes (Signed)
Ronald Fields.   (DOB:[birth year]) [place] at   [facility]  [address] [place] [address], [place]  Phone:[phone]   [phone]         Outpatient PHYSICAL THERAPY: Daily Note and Progress Report 04/19/2015      Fall Risk Score: 1 (? 5 = High Risk)    ICD-10: Treatment Diagnosis:  Stiffness in joint; left elbow (M25.522), stiffness of joint; left wrist (M25.532), pain in limb, unspecified, arm (M79.602)     REFERRING PHYSICIAN: Maryclare Bean, MD  MD Orders: evaluate and treat  Return Physician Appointment: in 4 weeks   MEDICAL/REFERRING DIAGNOSIS:(S46.212A) strain of muscle, fascia and tendon of other parts of biceps, left arm   DATE OF ONSET: surgery: 02/04/15    PRIOR LEVEL OF FUNCTION: independent, active  PRECAUTIONS/ALLERGIES: No Known Allergies    ASSESSMENT:  ????????This section established at most recent assessment??????????  Ronald Fields. has attended 8 scheduled PT visits s/p left biceps repair.  He has been progressing very well with ROM and overall strength. He continues to report elbow stiffness and intermittent pain mostly of wrist extensor muscles/tendons.  We have been addressing this with manual STM and friction mobilization to prevent chronic inflammation and trigger points.  He continues to need improvement with functional biceps strength and endurance and would benefit from continued PT to address discharge goals. I am recommending 2 x per week for 2-4 more weeks    PROBLEM LIST (Impairments causing functional limitations):  1. Decreased Strength affecting function  2. Decreased ADL/Functional Activities  3. Decreased Flexibility/joint mobility  4. Increased Pain affecting function  5. Other decreased knowledge of HEP  GOALS: (Goals have been discussed and agreed upon with patient.)  SHORT-TERM FUNCTIONAL GOALS:    1. Pt will be independent with HEP.(met)   2. Pt will report left UE pain does not exceed 2/10 during functional  activities. (ongoing)  3. Pt will improve left elbow extension by 10 degrees or greater to improve functional mobility. (met)   DISCHARGE GOALS: Time Frame: 6-8 week  1. Pt will demonstrate 5/5 strength forearm and elbow strength all planes.(ongoing)  2. Pt will report no limitations from left UE pain or stiffness during recreational and home activities. (ongoing)  3. Pt will demonstrate DASH score improvement by at least 10 points. (ongoing)    REHABILITATION POTENTIAL FOR STATED GOALS: GoodPLAN OF CARE:  INTERVENTIONS PLANNED: (Benefits and precautions of physical therapy have been discussed with the patient.)  1. cold  2. heat  3. home exercise program (HEP)  4. manual therapy  5. neuromuscular re-education/strengthening  6. range of motion: active/assisted/passive  7. therapeutic activities  8. therapeutic exercise/strengthening  TREATMENT PLAN EFFECTIVE DATES: 11.29.16 TO 1.29.17  FREQUENCY/DURATION: Follow patient 2 times a week for 2-4 more weeks to address above goals.  Regarding Ronald Vanderhoef Jr.'s therapy, I certify that the treatment plan above will be carried out by a therapist or under their direction.  Thank you for this referral,  Buren Kos, PT     Referring Physician Signature: Maryclare Bean, MD          Date                       SUBJECTIVE:  04/19/2015    pt states that he feels like his UE is progressing but continues to have functional limitations due to biceps fatigue and elbow stiffness.    *He states that he just  got back from a week vacation out of town.     History of Present Injury/Illness (Reason for Referral):   Pt states that on 01/28/15 he felt left bicep pain while lifting weights.  He did not alter any activities at that time and played full game of rugby.  Over the next few days, he experienced significant arm pain and elbow restriction.  Went to urgent care and was then referred to orthopedist.  Dx with distal bicep rupture. Bicipital repair performed on  02/04/15. Pt reports left elbow was immobilized in a splint for 5 weeks. No other rehab prior to today.      Present Symptoms: intermittent moderate pain of left elbow, wrist, shoulder     Aggravating Factors: lifting weight, lifting his children, dressing    Relieving Factors: ibuprofen   Pain level: 1-2/10 presently, 5/10 worst, 1/10 best      Dominant Side: right  Past Medical History:   Past Medical History   Diagnosis Date   ??? Left wrist pain      Current Medications:   Current Outpatient Prescriptions:   ???  NOREL AD 4-10-325 mg tab, TAKE 1 TABLET BY MOUTH TWICE A DAY, Disp: , Rfl: 2  ???  ondansetron (ZOFRAN ODT) 4 mg disintegrating tablet, TAKE 1 TABLET(S) UNDER THE TONGUE EVERY 4-6 HOURS AS NEEDED, Disp: , Rfl: 0  ???  ibuprofen 200 mg cap, Take  by mouth as needed., Disp: , Rfl:   ???  omega-3 fatty acids-vitamin e (FISH OIL) 1,000 mg cap, Take 1 Cap by mouth daily., Disp: , Rfl:   ???  clorazepate (TRANXENE) 3.75 mg tablet, Take 1 Tab by mouth two (2) times a day. Max Daily Amount: 7.5 mg. (Patient taking differently: Take 3.75 mg by mouth as needed for Sleep.), Disp: 36 Tab, Rfl: 2  ???  valACYclovir (VALTREX) 500 mg tablet, Take 1 Tab by mouth as needed. 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  Each section is scored on a 1-5 scale, 5 representing the greatest disability.   The scores of each section are added together for a total score of 55.    Observation/Orthostatic Postural Assessment:      no abnormalities  Palpation:      Surgical site healed, scar distal of cubital fossa    ROM:   Shoulder ROM  DATE  11.29.16 DATE  04/19/15   flexion R: WNL   L:  WNL  R:   L:    abd R: WNL  L:  WNL R:   L:    External rotation  R:  WFL   L:  WFL R:   L:    Internal rotation R: WFL   L: WFL R:   L:    Elbow flexion  R: 135  L:  135 R: 135  L: 135   Elbow extension  R: 0  L: -  15 R: 0  L: 0     Strength:    DATE  03/15/15 DATE  04/19/15   Shoulder flexion R: 5  L: 4- R: 5  L: 5   Shoulder abd R: 5  L: 4 R: 5  L: 5   shoulder External rotation  R: 5  L: 4+ R: 5  L: 5   shoulder Internal rotation R: 5  L: 4+ R: 5  L: 5   Elbow flexion  R: 5  L:  4- R: 5  L: 4+   Elbow extension  R: 5  L: 4+ R: 5  L: 5   forearm Pronation R: 5  L: 4 R: 5  L: 4   forearm Supination  R: 5  L: 4-  R:5  L: 4   Wrist flexion  R: 5  L: 4+ R:5  L:5   Wrist extension  R:5   L: 4+ R:5  L: 5     Special Tests: N/A  Neurological Screen:   Myotomes: normal                  Dermatomes: normal                  Reflexes: not tested                  Neural Tension Tests: not tested   Functional Mobility:      difficulty with lifting using left UE   Balance:      Good   TREATMENT:    (In addition to Assessment/Re-Assessment sessions the following treatments were rendered)  THERAPEUTIC EXERCISE: (8 minutes):  Exercises per grid below to improve mobility and strength.  Required moderate visual, verbal and manual cues to promote proper body alignment, promote proper body posture, promote proper body mechanics and promote proper body breathing techniques.  Progressed resistance, range and repetitions as indicated.   Date:  03/15/15 Date:  03-17-15 Date:  03-23-16 03/29/15 03/31/15 04/05/15 04/07/15 04/18/14   Activity/Exercise Parameters Parameters Parameters        UBE    6 mins  Level 1  Forward  8 mins  Level 3   Forward  6 min level 3 fwd  6 min level 3  4/4 min level 3  4/4 min level 5 4/4 min level 5   Wrist extension/flex       3# 2 x 10                Wrist supination/pronation  X 20       3# 2 x 10     Rows    17 lb  2x10 17 lb cable  2x10  13# cable 2 x 10  13# cable 2 x 10  17# cable 2 x 10     Shoulder extension    17 lb  2x10 17 lb cable   2x10  13# cable 2 x 10       Shoulder ER/IR     7 lb cable IR  x15  3 lb cable ER  x15        Wrist extensor stretching     Demo do       Shoulder flexion     3# 2 x 10  3# x 10      shoulder abd      3 # 2 x 10  3# x 10      Shoulder scaption  3# x 10 (above shoulder) 3# x 10      Elbow flexion      Nuetral, pronated, supinated x 10 each, 3#  Nuetral, pronated, supinated x 10 each, 3#      tricep pull down        Cables x 7# each 2 x 10     AROM and strength testing         X 5 min      Manual Therapy (   30 min   ):   ?? Trigger point release to wrist extensors and forearm supinator musculature using instrument assist .   ?? Wrist extensor stretching   ?? PA's and AP's of prox radial head to decrease stiffness  ?? Grade IV--  prox ulnar AP's    Therapeutic Modalities: x 10 min cold back to left elbow region .                                                                                               HEP: As above; handouts given to patient for all exercises.  ______________________________________________________________________________________________________    Treatment Assessment:  Continues to be tender of cubital fossa and posterior forearm. Weakness most apparent with elbow flex, forearm supination, and forearm pronation      Next treatment appointment scheduled:  Future Appointments  Date Time Provider [place]   04/21/2015 7:45 AM Buren Kos, PT [facility]     Progression/Medical Necessity:   ?? Patient is expected to demonstrate progress in strength and range of motion to decrease assistance required with recreational and home activities .   Compliance with Program/Exercises: Will assess as treatment progresses.   Reason for Continuation of Services/Other Comments:  ?? Patient continues to require modification of therapeutic interventions to increase complexity of exercises.  Recommendations/Intent for next treatment session: "Treatment next visit will focus on advancements to more challenging activities". AROM and PROM of left elbow, forearm and shoulder, scar mobilization, ice    Total Treatment Duration:  PT Patient Time In/Time Out  Time In: 0745  Time Out: [place], PT

## 2015-04-21 NOTE — Progress Notes (Signed)
Ronald LongestMichael Pirani Jr.   (DOB:1976-01-11) [facility] at   [facility]  [address], [address], [place] [id number]  [phone]   Fax:[phone]       OUTPATIENT DAILY NOTE    NAME/AGE/GENDER: Ronald LongestMichael Losee Jr. is a 40 y.o. male.     DATE: 04/21/2015    Patient did not show for appointment today.  Will plan to follow up on next scheduled visit.    Danise MinaKristin M Adin Lariccia, DPT

## 2015-04-22 ENCOUNTER — Inpatient Hospital Stay: Payer: BLUE CROSS/BLUE SHIELD | Primary: Nurse Practitioner

## 2015-04-28 ENCOUNTER — Inpatient Hospital Stay: Admit: 2015-04-28 | Payer: BLUE CROSS/BLUE SHIELD | Primary: Nurse Practitioner

## 2015-04-28 NOTE — Progress Notes (Signed)
Ronald Fields.   (DOB:1975/12/13) [place] at   [facility]  [address], [address], [place]  [phone]   [phone]         Outpatient PHYSICAL THERAPY: Daily Note 04/28/2015      Fall Risk Score: 1 (? 5 = High Risk)    ICD-10: Treatment Diagnosis:  Stiffness in joint; left elbow (M25.522), stiffness of joint; left wrist (M25.532), pain in limb, unspecified, arm (M79.602)     REFERRING PHYSICIAN: Maryclare Bean, MD  MD Orders: evaluate and treat  Return Physician Appointment: in 4 weeks   MEDICAL/REFERRING DIAGNOSIS:(S46.212A) strain of muscle, fascia and tendon of other parts of biceps, left arm   DATE OF ONSET: surgery: 02/04/15    PRIOR LEVEL OF FUNCTION: independent, active  PRECAUTIONS/ALLERGIES: No Known Allergies    ASSESSMENT:  ????????This section established at most recent assessment??????????  Ronald Fields. has attended 8 scheduled PT visits s/p left biceps repair.  He has been progressing very well with ROM and overall strength. He continues to report elbow stiffness and intermittent pain mostly of wrist extensor muscles/tendons.  We have been addressing this with manual STM and friction mobilization to prevent chronic inflammation and trigger points.  He continues to need improvement with functional biceps strength and endurance and would benefit from continued PT to address discharge goals. I am recommending 2 x per week for 2-4 more weeks    PROBLEM LIST (Impairments causing functional limitations):  1. Decreased Strength affecting function  2. Decreased ADL/Functional Activities  3. Decreased Flexibility/joint mobility  4. Increased Pain affecting function  5. Other decreased knowledge of HEP  GOALS: (Goals have been discussed and agreed upon with patient.)  SHORT-TERM FUNCTIONAL GOALS:    1. Pt will be independent with HEP.(met)   2. Pt will report left UE pain does not exceed 2/10 during functional activities. (ongoing)   3. Pt will improve left elbow extension by 10 degrees or greater to improve functional mobility. (met)   DISCHARGE GOALS: Time Frame: 6-8 week  1. Pt will demonstrate 5/5 strength forearm and elbow strength all planes.(ongoing)  2. Pt will report no limitations from left UE pain or stiffness during recreational and home activities. (ongoing)  3. Pt will demonstrate DASH score improvement by at least 10 points. (ongoing)    REHABILITATION POTENTIAL FOR STATED GOALS: GoodPLAN OF CARE:  INTERVENTIONS PLANNED: (Benefits and precautions of physical therapy have been discussed with the patient.)  1. cold  2. heat  3. home exercise program (HEP)  4. manual therapy  5. neuromuscular re-education/strengthening  6. range of motion: active/assisted/passive  7. therapeutic activities  8. therapeutic exercise/strengthening  TREATMENT PLAN EFFECTIVE DATES: 11.29.16 TO 1.29.17  FREQUENCY/DURATION: Follow patient 2 times a week for 2-4 more weeks to address above goals.  Regarding Ronald Gershman Jr.'s therapy, I certify that the treatment plan above will be carried out by a therapist or under their direction.  Thank you for this referral,  Buren Kos, PT     Referring Physician Signature: Maryclare Bean, MD          Date                       SUBJECTIVE:  04/28/2015    pt states that he is doing pretty well. MD happy with progress but ordered 6 more weeks of PT.  Pt continues to have some difficulty with pain and weakness during wrist and elbow  movement with resistance.     History of Present Injury/Illness (Reason for Referral):   Pt states that on 01/28/15 he felt left bicep pain while lifting weights.  He did not alter any activities at that time and played full game of rugby.  Over the next few days, he experienced significant arm pain and elbow restriction.  Went to urgent care and was then referred to orthopedist.  Dx with distal bicep rupture. Bicipital repair performed on  02/04/15. Pt reports left elbow was immobilized in a splint for 5 weeks. No other rehab prior to today.      Present Symptoms: intermittent moderate pain of left elbow, wrist, shoulder     Aggravating Factors: lifting weight, lifting his children, dressing    Relieving Factors: ibuprofen   Pain level: 1-2/10 presently, 5/10 worst, 1/10 best      Dominant Side: right  Past Medical History:   Past Medical History   Diagnosis Date   ??? Left wrist pain      Current Medications:   Current Outpatient Prescriptions:   ???  NOREL AD 4-10-325 mg tab, TAKE 1 TABLET BY MOUTH TWICE A DAY, Disp: , Rfl: 2  ???  ondansetron (ZOFRAN ODT) 4 mg disintegrating tablet, TAKE 1 TABLET(S) UNDER THE TONGUE EVERY 4-6 HOURS AS NEEDED, Disp: , Rfl: 0  ???  ibuprofen 200 mg cap, Take  by mouth as needed., Disp: , Rfl:   ???  omega-3 fatty acids-vitamin e (FISH OIL) 1,000 mg cap, Take 1 Cap by mouth daily., Disp: , Rfl:   ???  clorazepate (TRANXENE) 3.75 mg tablet, Take 1 Tab by mouth two (2) times a day. Max Daily Amount: 7.5 mg. (Patient taking differently: Take 3.75 mg by mouth as needed for Sleep.), Disp: 36 Tab, Rfl: 2  ???  valACYclovir (VALTREX) 500 mg tablet, Take 1 Tab by mouth as needed. 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  Each section is scored on a 1-5 scale, 5 representing the greatest disability.   The scores of each section are added together for a total score of 55.    Observation/Orthostatic Postural Assessment:      no abnormalities  Palpation:      Surgical site healed, scar distal of cubital fossa    ROM:   Shoulder ROM  DATE  11.29.16 DATE  04/19/15   flexion R: WNL   L:  WNL  R:   L:    abd R: WNL  L:  WNL R:   L:    External rotation  R:  WFL   L:  WFL R:   L:    Internal rotation R: WFL   L: WFL R:   L:    Elbow flexion  R: 135  L:  135 R: 135  L: 135   Elbow extension  R: 0  L: -15 R: 0  L: 0  Strength:    DATE  03/15/15 DATE  04/19/15   Shoulder flexion R: 5  L: 4- R: 5  L: 5   Shoulder abd R: 5  L: 4 R: 5  L: 5   shoulder External rotation  R: 5  L: 4+ R: 5  L: 5   shoulder Internal rotation R: 5  L: 4+ R: 5  L: 5   Elbow flexion  R: 5  L:  4- R: 5  L: 4+   Elbow extension  R: 5  L: 4+ R: 5  L: 5   forearm Pronation R: 5  L: 4 R: 5  L: 4   forearm Supination  R: 5  L: 4-  R:5  L: 4   Wrist flexion  R: 5  L: 4+ R:5  L:5   Wrist extension  R:5   L: 4+ R:5  L: 5     Special Tests: N/A  Neurological Screen:   Myotomes: normal                  Dermatomes: normal                  Reflexes: not tested                  Neural Tension Tests: not tested   Functional Mobility:      difficulty with lifting using left UE   Balance:      Good   TREATMENT:    (In addition to Assessment/Re-Assessment sessions the following treatments were rendered)  THERAPEUTIC EXERCISE: (15 minutes):  Exercises per grid below to improve mobility and strength.  Required moderate visual, verbal and manual cues to promote proper body alignment, promote proper body posture, promote proper body mechanics and promote proper body breathing techniques.  Progressed resistance, range and repetitions as indicated.   Date:  03-17-15 Date:  03-23-16 03/29/15 03/31/15 04/05/15 04/07/15 04/19/15 04/28/15   Activity/Exercise Parameters Parameters         UBE   6 mins  Level 1  Forward  8 mins  Level 3   Forward  6 min level 3 fwd  6 min level 3  4/4 min level 3  4/4 min level 5 4/4 min level 5 4/4 min level 5    Wrist extension/flex      3# 2 x 10   4# x 20              Wrist supination/pronation       3# 2 x 10   4# x 20    Rows   17 lb  2x10 17 lb cable  2x10  13# cable 2 x 10  13# cable 2 x 10  17# cable 2 x 10      Shoulder extension   17 lb  2x10 17 lb cable   2x10  13# cable 2 x 10        Shoulder ER/IR    7 lb cable IR  x15  3 lb cable ER  x15         Wrist extensor stretching    Demo do        Shoulder flexion    3# 2 x 10  3# x 10       shoulder abd     3 # 2 x 10  3# x 10       Shoulder scaption     3# x 10 (  above shoulder) 3# x 10       Elbow flexion     Nuetral, pronated, supinated x 10 each, 3#  Nuetral, pronated, supinated x 10 each, 3#    Nuetral, pronated, supinated 2 x 10 each, 4#    tricep pull down       Cables x 7# each 2 x 10      AROM and strength testing        X 5 min       Manual Therapy (   15 min   ):   ?? Trigger point release to wrist extensors and forearm supinator musculature using instrument assist .   ?? Wrist extensor stretching   ?? PA's and AP's of prox radial head to decrease stiffness  ?? Grade IV--  prox ulnar AP's    Therapeutic Modalities: x 10 min cold back to left elbow region .                                                                                               HEP: As above; handouts given to patient for all exercises.  ______________________________________________________________________________________________________    Treatment Assessment:  Fatigue with bicep work.      Next treatment appointment scheduled:  Future Appointments  Date Time Provider [place]   05/02/2015 7:45 AM Buren Kos, PT [facility]   05/04/2015 7:45 AM Buren Kos, PT [facility]   05/10/2015 7:45 AM Buren Kos, PT [facility]   05/12/2015 8:45 AM Buren Kos, PT [facility]     Progression/Medical Necessity:    ?? Patient is expected to demonstrate progress in strength and range of motion to decrease assistance required with recreational and home activities .  Compliance with Program/Exercises: Will assess as treatment progresses.   Reason for Continuation of Services/Other Comments:  ?? Patient continues to require modification of therapeutic interventions to increase complexity of exercises.  Recommendations/Intent for next treatment session: "Treatment next visit will focus on advancements to more challenging activities". AROM and PROM of left elbow, forearm and shoulder, scar mobilization, ice    Total Treatment Duration:  PT Patient Time In/Time Out  Time In: 0745  Time Out: 0830    Buren Kos, PT

## 2015-05-02 ENCOUNTER — Encounter: Payer: BLUE CROSS/BLUE SHIELD | Primary: Nurse Practitioner

## 2015-05-04 ENCOUNTER — Inpatient Hospital Stay: Admit: 2015-05-04 | Payer: BLUE CROSS/BLUE SHIELD | Primary: Nurse Practitioner

## 2015-05-04 NOTE — Progress Notes (Signed)
Ronald Fields.   (DOB:11/26/1975) [place] at   [facility]  [address], [place]  [phone]   [phone]         Outpatient PHYSICAL THERAPY: Daily Note 05/04/2015      Fall Risk Score: 1 (? 5 = High Risk)    ICD-10: Treatment Diagnosis:  Stiffness in joint; left elbow (M25.522), stiffness of joint; left wrist (M25.532), pain in limb, unspecified, arm (M79.602)     REFERRING PHYSICIAN: Maryclare Bean, MD  MD Orders: evaluate and treat  Return Physician Appointment: in 4 weeks   MEDICAL/REFERRING DIAGNOSIS:(S46.212A) strain of muscle, fascia and tendon of other parts of biceps, left arm   DATE OF ONSET: surgery: 02/04/15    PRIOR LEVEL OF FUNCTION: independent, active  PRECAUTIONS/ALLERGIES: No Known Allergies    ASSESSMENT:  ????????This section established at most recent assessment??????????  Ronald Fields. has attended 8 scheduled PT visits s/p left biceps repair.  He has been progressing very well with ROM and overall strength. He continues to report elbow stiffness and intermittent pain mostly of wrist extensor muscles/tendons.  We have been addressing this with manual STM and friction mobilization to prevent chronic inflammation and trigger points.  He continues to need improvement with functional biceps strength and endurance and would benefit from continued PT to address discharge goals. I am recommending 2 x per week for 2-4 more weeks    PROBLEM LIST (Impairments causing functional limitations):  1. Decreased Strength affecting function  2. Decreased ADL/Functional Activities  3. Decreased Flexibility/joint mobility  4. Increased Pain affecting function  5. Other decreased knowledge of HEP  GOALS: (Goals have been discussed and agreed upon with patient.)  SHORT-TERM FUNCTIONAL GOALS:    1. Pt will be independent with HEP.(met)   2. Pt will report left UE pain does not exceed 2/10 during functional activities. (ongoing)   3. Pt will improve left elbow extension by 10 degrees or greater to improve functional mobility. (met)   DISCHARGE GOALS: Time Frame: 6-8 week  1. Pt will demonstrate 5/5 strength forearm and elbow strength all planes.(ongoing)  2. Pt will report no limitations from left UE pain or stiffness during recreational and home activities. (ongoing)  3. Pt will demonstrate DASH score improvement by at least 10 points. (ongoing)    REHABILITATION POTENTIAL FOR STATED GOALS: GoodPLAN OF CARE:  INTERVENTIONS PLANNED: (Benefits and precautions of physical therapy have been discussed with the patient.)  1. cold  2. heat  3. home exercise program (HEP)  4. manual therapy  5. neuromuscular re-education/strengthening  6. range of motion: active/assisted/passive  7. therapeutic activities  8. therapeutic exercise/strengthening  TREATMENT PLAN EFFECTIVE DATES: 11.29.16 TO 1.29.17  FREQUENCY/DURATION: Follow patient 2 times a week for 2-4 more weeks to address above goals.  Regarding Ronald Nardozzi Jr.'s therapy, I certify that the treatment plan above will be carried out by a therapist or under their direction.  Thank you for this referral,  Buren Kos, PT     Referring Physician Signature: Maryclare Bean, MD          Date                       SUBJECTIVE:  05/04/2015    pt states that he continues to have some forearm pain but usually feels improvement after manual treatment in PT.  continues to attend class at gym that involves punching motions and bearing body weight  through UE- he feels some weakness and fatigue and mild pain with these activities.      History of Present Injury/Illness (Reason for Referral):   Pt states that on 01/28/15 he felt left bicep pain while lifting weights.  He did not alter any activities at that time and played full game of rugby.  Over the next few days, he experienced significant arm pain and elbow restriction.  Went to urgent care and was then referred to  orthopedist.  Dx with distal bicep rupture. Bicipital repair performed on 02/04/15. Pt reports left elbow was immobilized in a splint for 5 weeks. No other rehab prior to today.      Present Symptoms: intermittent moderate pain of left elbow, wrist, shoulder     Aggravating Factors: lifting weight, lifting his children, dressing    Relieving Factors: ibuprofen   Pain level: 1-2/10 presently, 5/10 worst, 1/10 best      Dominant Side: right  Past Medical History:   Past Medical History   Diagnosis Date   ??? Left wrist pain      Current Medications:   Current Outpatient Prescriptions:   ???  NOREL AD 4-10-325 mg tab, TAKE 1 TABLET BY MOUTH TWICE A DAY, Disp: , Rfl: 2  ???  ondansetron (ZOFRAN ODT) 4 mg disintegrating tablet, TAKE 1 TABLET(S) UNDER THE TONGUE EVERY 4-6 HOURS AS NEEDED, Disp: , Rfl: 0  ???  ibuprofen 200 mg cap, Take  by mouth as needed., Disp: , Rfl:   ???  omega-3 fatty acids-vitamin e (FISH OIL) 1,000 mg cap, Take 1 Cap by mouth daily., Disp: , Rfl:   ???  clorazepate (TRANXENE) 3.75 mg tablet, Take 1 Tab by mouth two (2) times a day. Max Daily Amount: 7.5 mg. (Patient taking differently: Take 3.75 mg by mouth as needed for Sleep.), Disp: 36 Tab, Rfl: 2  ???  valACYclovir (VALTREX) 500 mg tablet, Take 1 Tab by mouth as needed. 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  Each  section is scored on a 1-5 scale, 5 representing the greatest disability.  The scores of each section are added together for a total score of 55.    Observation/Orthostatic Postural Assessment:      no abnormalities  Palpation:      Surgical site healed, scar distal of cubital fossa    ROM:   Shoulder ROM  DATE  11.29.16 DATE  04/19/15   flexion R: WNL   L:  WNL  R:   L:    abd R: WNL  L:  WNL R:   L:    External rotation  R:  WFL   L:  WFL R:   L:    Internal rotation R: WFL   L: WFL R:   L:    Elbow flexion  R: 135  L:  135 R: 135  L: 135   Elbow  extension  R: 0  L: -15 R: 0  L: 0     Strength:    DATE  03/15/15 DATE  04/19/15   Shoulder flexion R: 5  L: 4- R: 5  L: 5   Shoulder abd R: 5  L: 4 R: 5  L: 5   shoulder External rotation  R: 5  L: 4+ R: 5  L: 5   shoulder Internal rotation R: 5  L: 4+ R: 5  L: 5   Elbow flexion  R: 5  L:  4- R: 5  L: 4+   Elbow extension  R: 5  L: 4+ R: 5  L: 5   forearm Pronation R: 5  L: 4 R: 5  L: 4   forearm Supination  R: 5  L: 4-  R:5  L: 4   Wrist flexion  R: 5  L: 4+ R:5  L:5   Wrist extension  R:5   L: 4+ R:5  L: 5     Special Tests: N/A  Neurological Screen:   Myotomes: normal                  Dermatomes: normal                  Reflexes: not tested                  Neural Tension Tests: not tested   Functional Mobility:      difficulty with lifting using left UE   Balance:      Good   TREATMENT:    (In addition to Assessment/Re-Assessment sessions the following treatments were rendered)  THERAPEUTIC EXERCISE: (15 minutes):  Exercises per grid below to improve mobility and strength.  Required moderate visual, verbal and manual cues to promote proper body alignment, promote proper body posture, promote proper body mechanics and promote proper body breathing techniques.  Progressed resistance, range and repetitions as indicated.   Date:  03-23-16 03/29/15 03/31/15 04/05/15 04/07/15 04/19/15 04/28/15 05/04/15   Activity/Exercise Parameters          UBE   8 mins  Level 3   Forward  6 min level 3 fwd  6 min level 3  4/4 min level 3  4/4 min level 5 4/4 min level 5 4/4 min level 5  4/4 level 5    Wrist extension/flex     3# 2 x 10   4# x 20               Wrist supination/pronation      3# 2 x 10   4# x 20     Rows   17 lb cable  2x10  13# cable 2 x 10  13# cable 2 x 10  17# cable 2 x 10       Shoulder extension   17 lb cable   2x10  13# cable 2 x 10         Shoulder ER/IR   7 lb cable IR  x15  3 lb cable ER  x15          Wrist extensor stretching   Demo do         Shoulder flexion   3# 2 x 10  3# x 10        shoulder abd    3 # 2 x 10  3# x 10        Shoulder scaption  3# x 10 (above shoulder) 3# x 10        Elbow flexion    Nuetral, pronated, supinated x 10 each, 3#  Nuetral, pronated, supinated x 10 each, 3#    Nuetral, pronated, supinated 2 x 10 each, 4#  Nuetral, pronated, supinated 2 x 10 each, 4#    tricep pull down      Cables x 7# each 2 x 10       AROM and strength testing       X 5 min        Manual Therapy (   25 min   ):   ?? Trigger point release to wrist extensors and forearm supinator musculature using instrument assist .   ?? Wrist extensor stretching   ?? PA's and AP's of prox radial head to decrease stiffness  ?? Grade IV--  prox ulnar AP's    Therapeutic Modalities: x 10 min cold back to left elbow and forearm                                                                                               HEP: As above; handouts given to patient for all exercises.  ______________________________________________________________________________________________________    Treatment Assessment:  Fatigue with bicep work. Decreased trigger points of wrist flexors and extensors after manual treatment.     Next treatment appointment scheduled:  Future Appointments  Date Time Provider [place]   05/10/2015 7:45 AM Buren Kos, PT [facility]   05/12/2015 8:45 AM Buren Kos, PT [facility]   05/17/2015 7:45 AM Buren Kos, PT [facility]    05/19/2015 8:45 AM Buren Kos, PT [facility]     Progression/Medical Necessity:   ?? Patient is expected to demonstrate progress in strength and range of motion to decrease assistance required with recreational and home activities .  Compliance with Program/Exercises: Will assess as treatment progresses.   Reason for Continuation of Services/Other Comments:  ?? Patient continues to require modification of therapeutic interventions to increase complexity of exercises.  Recommendations/Intent for next treatment session: "Treatment next visit will focus on advancements to more challenging activities". AROM and PROM of left elbow, forearm and shoulder, scar mobilization, ice    Total Treatment Duration:  PT Patient Time In/Time Out  Time In: 0750  Time Out: [place], PT

## 2015-05-10 ENCOUNTER — Inpatient Hospital Stay: Admit: 2015-05-10 | Payer: BLUE CROSS/BLUE SHIELD | Primary: Nurse Practitioner

## 2015-05-10 NOTE — Progress Notes (Signed)
Ronald Fields.   (DOB:January 16, 1976) [place] at   [facility]  [address], [place]  Phone:[phone]   [phone]         Outpatient PHYSICAL THERAPY: Daily Note 05/10/2015      Fall Risk Score: 1 (? 5 = High Risk)    ICD-10: Treatment Diagnosis:  Stiffness in joint; left elbow (M25.522), stiffness of joint; left wrist (M25.532), pain in limb, unspecified, arm (M79.602)     REFERRING PHYSICIAN: Maryclare Bean, MD  MD Orders: evaluate and treat  Return Physician Appointment: in 4 weeks   MEDICAL/REFERRING DIAGNOSIS:(S46.212A) strain of muscle, fascia and tendon of other parts of biceps, left arm   DATE OF ONSET: surgery: 02/04/15    PRIOR LEVEL OF FUNCTION: independent, active  PRECAUTIONS/ALLERGIES: No Known Allergies    ASSESSMENT:  ????????This section established at most recent assessment??????????  Ronald Fields. has attended 8 scheduled PT visits s/p left biceps repair.  He has been progressing very well with ROM and overall strength. He continues to report elbow stiffness and intermittent pain mostly of wrist extensor muscles/tendons.  We have been addressing this with manual STM and friction mobilization to prevent chronic inflammation and trigger points.  He continues to need improvement with functional biceps strength and endurance and would benefit from continued PT to address discharge goals. I am recommending 2 x per week for 2-4 more weeks    PROBLEM LIST (Impairments causing functional limitations):  1. Decreased Strength affecting function  2. Decreased ADL/Functional Activities  3. Decreased Flexibility/joint mobility  4. Increased Pain affecting function  5. Other decreased knowledge of HEP  GOALS: (Goals have been discussed and agreed upon with patient.)  SHORT-TERM FUNCTIONAL GOALS:    1. Pt will be independent with HEP.(met)   2. Pt will report left UE pain does not exceed 2/10 during functional activities. (ongoing)   3. Pt will improve left elbow extension by 10 degrees or greater to improve functional mobility. (met)   DISCHARGE GOALS: Time Frame: 6-8 week  1. Pt will demonstrate 5/5 strength forearm and elbow strength all planes.(ongoing)  2. Pt will report no limitations from left UE pain or stiffness during recreational and home activities. (ongoing)  3. Pt will demonstrate DASH score improvement by at least 10 points. (ongoing)    REHABILITATION POTENTIAL FOR STATED GOALS: GoodPLAN OF CARE:  INTERVENTIONS PLANNED: (Benefits and precautions of physical therapy have been discussed with the patient.)  1. cold  2. heat  3. home exercise program (HEP)  4. manual therapy  5. neuromuscular re-education/strengthening  6. range of motion: active/assisted/passive  7. therapeutic activities  8. therapeutic exercise/strengthening  TREATMENT PLAN EFFECTIVE DATES: 11.29.16 TO 1.29.17  FREQUENCY/DURATION: Follow patient 2 times a week for 2-4 more weeks to address above goals.  Regarding Ronald Flakes Jr.'s therapy, I certify that the treatment plan above will be carried out by a therapist or under their direction.  Thank you for this referral,  Buren Kos, PT     Referring Physician Signature: Maryclare Bean, MD          Date                       SUBJECTIVE:  05/10/2015    pt states that he is doing pretty well.  His forearm is less pain but his elbow was painful last night. He has been very active over last few days and feels like he  is getting better function with arm in general.       History of Present Injury/Illness (Reason for Referral):   Pt states that on 01/28/15 he felt left bicep pain while lifting weights.  He did not alter any activities at that time and played full game of rugby.  Over the next few days, he experienced significant arm pain and elbow restriction.  Went to urgent care and was then referred to orthopedist.  Dx with distal bicep rupture. Bicipital repair performed on  02/04/15. Pt reports left elbow was immobilized in a splint for 5 weeks. No other rehab prior to today.      Present Symptoms: intermittent moderate pain of left elbow, wrist, shoulder     Aggravating Factors: lifting weight, lifting his children, dressing    Relieving Factors: ibuprofen   Pain level: 1-2/10 presently, 5/10 worst, 1/10 best      Dominant Side: right  Past Medical History:   Past Medical History   Diagnosis Date   ??? Left wrist pain      Current Medications:   Current Outpatient Prescriptions:   ???  NOREL AD 4-10-325 mg tab, TAKE 1 TABLET BY MOUTH TWICE A DAY, Disp: , Rfl: 2  ???  ondansetron (ZOFRAN ODT) 4 mg disintegrating tablet, TAKE 1 TABLET(S) UNDER THE TONGUE EVERY 4-6 HOURS AS NEEDED, Disp: , Rfl: 0  ???  ibuprofen 200 mg cap, Take  by mouth as needed., Disp: , Rfl:   ???  omega-3 fatty acids-vitamin e (FISH OIL) 1,000 mg cap, Take 1 Cap by mouth daily., Disp: , Rfl:   ???  clorazepate (TRANXENE) 3.75 mg tablet, Take 1 Tab by mouth two (2) times a day. Max Daily Amount: 7.5 mg. (Patient taking differently: Take 3.75 mg by mouth as needed for Sleep.), Disp: 36 Tab, Rfl: 2  ???  valACYclovir (VALTREX) 500 mg tablet, Take 1 Tab by mouth as needed. 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  Each section is scored on a 1-5 scale, 5 representing the greatest disability.   The scores of each section are added together for a total score of 55.    Observation/Orthostatic Postural Assessment:      no abnormalities  Palpation:      Surgical site healed, scar distal of cubital fossa    ROM:   Shoulder ROM  DATE  11.29.16 DATE  04/19/15   flexion R: WNL   L:  WNL  R:   L:    abd R: WNL  L:  WNL R:   L:    External rotation  R:  WFL   L:  WFL R:   L:    Internal rotation R: WFL   L: WFL R:   L:    Elbow flexion  R: 135  L:  135 R: 135  L: 135   Elbow extension  R: 0  L: -15 R: 0  L: 0     Strength:    DATE  03/15/15 DATE  04/19/15   Shoulder flexion R: 5  L: 4- R: 5  L: 5   Shoulder abd R: 5  L: 4 R: 5  L: 5   shoulder External rotation  R: 5  L: 4+ R: 5  L: 5   shoulder Internal rotation R: 5  L: 4+ R: 5  L: 5   Elbow flexion  R: 5  L:  4- R: 5  L: 4+   Elbow extension  R: 5  L: 4+ R: 5  L: 5   forearm Pronation R: 5  L: 4 R: 5  L: 4   forearm Supination  R: 5  L: 4-  R:5  L: 4   Wrist flexion  R: 5  L: 4+ R:5  L:5   Wrist extension  R:5   L: 4+ R:5  L: 5     Special Tests: N/A  Neurological Screen:   Myotomes: normal                  Dermatomes: normal                  Reflexes: not tested                  Neural Tension Tests: not tested   Functional Mobility:      difficulty with lifting using left UE   Balance:      Good   TREATMENT:    (In addition to Assessment/Re-Assessment sessions the following treatments were rendered)  THERAPEUTIC EXERCISE: (30 minutes):  Exercises per grid below to improve mobility and strength.  Required moderate visual, verbal and manual cues to promote proper body alignment, promote proper body posture, promote proper body mechanics and promote proper body breathing techniques.  Progressed resistance, range and repetitions as indicated.   03/31/15 04/05/15 04/07/15 04/19/15 04/28/15 05/04/15 05/10/15   Activity/Exercise          UBE   6 min level 3  4/4 min level 3  4/4 min level 5 4/4 min level 5 4/4 min level 5  4/4 level 5  4/4 level 5    Wrist extension/flex   3# 2 x 10   4# x 20               Wrist supination/pronation    3# 2 x 10   4# x 20      Rows   13# cable 2 x 10  13# cable 2 x 10  17# cable 2 x 10     17# cable 2 x 10    Shoulder extension   13# cable 2 x 10          Shoulder ER/IR            Wrist extensor stretching           Shoulder flexion 3# 2 x 10  3# x 10      3# 2 x 10    shoulder abd  3 # 2 x 10  3# x 10      3# 2 x 10    Shoulder scaption  3# x 10 (above shoulder) 3# x 10      3# 2 x 10    Elbow flexion  Nuetral, pronated, supinated x 10 each, 3#  Nuetral, pronated, supinated x 10 each, 3#  Nuetral, pronated, supinated 2 x 10 each, 4#  Nuetral, pronated, supinated 2 x 10 each, 4#  Nuetral, pronated, supinated 2 x 10 each, 5# focusing on eccentric control    tricep pull down    Cables x 7# each 2 x 10        AROM and strength testing     X 5 min       Chest press        13# 2 x 10      Manual Therapy (   15 min   ):   ?? MFR to left wrist extensor muscles using deep eccentric release techniques     Therapeutic Modalities: x 10 min cold back to left elbow and forearm                                                                                               HEP: As above; handouts given to patient for all exercises.  ______________________________________________________________________________________________________    Treatment Assessment:  Significant trigger points remain of wrist extensors. mild lateral elbow pain at end of session.      Next treatment appointment scheduled:  Future Appointments  Date Time Provider [place]   05/12/2015 8:45 AM Buren Kos, PT [facility]   05/17/2015 7:45 AM Buren Kos, PT [facility]   05/19/2015 8:45 AM Buren Kos, PT [facility]     Progression/Medical Necessity:   ?? Patient is expected to demonstrate progress in strength and range of motion to decrease assistance required with recreational and home activities .   Compliance with Program/Exercises: Will assess as treatment progresses.   Reason for Continuation of Services/Other Comments:  ?? Patient continues to require modification of therapeutic interventions to increase complexity of exercises.  Recommendations/Intent for next treatment session: "Treatment next visit will focus on advancements to more challenging activities". AROM and PROM of left elbow, forearm and shoulder, scar mobilization, ice    Total Treatment Duration:  PT Patient Time In/Time Out  Time In: 0745  Time Out: [place], PT

## 2015-05-12 ENCOUNTER — Inpatient Hospital Stay: Admit: 2015-05-12 | Payer: BLUE CROSS/BLUE SHIELD | Primary: Nurse Practitioner

## 2015-05-12 NOTE — Progress Notes (Signed)
Ronald Fields.   (DOB:1975-06-22) [place] at   [facility]  [address], [place]  [phone]   [phone]         Outpatient PHYSICAL THERAPY: Daily Note 05/12/2015      Fall Risk Score: 1 (? 5 = High Risk)    ICD-10: Treatment Diagnosis:  Stiffness in joint; left elbow (M25.522), stiffness of joint; left wrist (M25.532), pain in limb, unspecified, arm (M79.602)     REFERRING PHYSICIAN: Maryclare Bean, MD  MD Orders: evaluate and treat  Return Physician Appointment: in 4 weeks   MEDICAL/REFERRING DIAGNOSIS:(S46.212A) strain of muscle, fascia and tendon of other parts of biceps, left arm   DATE OF ONSET: surgery: 02/04/15    PRIOR LEVEL OF FUNCTION: independent, active  PRECAUTIONS/ALLERGIES: No Known Allergies    ASSESSMENT:  ????????This section established at most recent assessment??????????  Ronald Fields. has attended 8 scheduled PT visits s/p left biceps repair.  He has been progressing very well with ROM and overall strength. He continues to report elbow stiffness and intermittent pain mostly of wrist extensor muscles/tendons.  We have been addressing this with manual STM and friction mobilization to prevent chronic inflammation and trigger points.  He continues to need improvement with functional biceps strength and endurance and would benefit from continued PT to address discharge goals. I am recommending 2 x per week for 2-4 more weeks    PROBLEM LIST (Impairments causing functional limitations):  1. Decreased Strength affecting function  2. Decreased ADL/Functional Activities  3. Decreased Flexibility/joint mobility  4. Increased Pain affecting function  5. Other decreased knowledge of HEP  GOALS: (Goals have been discussed and agreed upon with patient.)  SHORT-TERM FUNCTIONAL GOALS:    1. Pt will be independent with HEP.(met)   2. Pt will report left UE pain does not exceed 2/10 during functional activities. (ongoing)   3. Pt will improve left elbow extension by 10 degrees or greater to improve functional mobility. (met)   DISCHARGE GOALS: Time Frame: 6-8 week  1. Pt will demonstrate 5/5 strength forearm and elbow strength all planes.(ongoing)  2. Pt will report no limitations from left UE pain or stiffness during recreational and home activities. (ongoing)  3. Pt will demonstrate DASH score improvement by at least 10 points. (ongoing)    REHABILITATION POTENTIAL FOR STATED GOALS: GoodPLAN OF CARE:  INTERVENTIONS PLANNED: (Benefits and precautions of physical therapy have been discussed with the patient.)  1. cold  2. heat  3. home exercise program (HEP)  4. manual therapy  5. neuromuscular re-education/strengthening  6. range of motion: active/assisted/passive  7. therapeutic activities  8. therapeutic exercise/strengthening  TREATMENT PLAN EFFECTIVE DATES: 11.29.16 TO 1.29.17  FREQUENCY/DURATION: Follow patient 2 times a week for 2-4 more weeks to address above goals.  Regarding Anvith Mauriello Jr.'s therapy, I certify that the treatment plan above will be carried out by a therapist or under their direction.  Thank you for this referral,  Buren Kos, PT     Referring Physician Signature: Maryclare Bean, MD          Date                       SUBJECTIVE:  05/12/2015    pt continues to report some lateral elbow pain but becoming more localized. Marland Kitchen       History of Present Injury/Illness (Reason for Referral):   Pt states that on 01/28/15 he  felt left bicep pain while lifting weights.  He did not alter any activities at that time and played full game of rugby.  Over the next few days, he experienced significant arm pain and elbow restriction.  Went to urgent care and was then referred to orthopedist.  Dx with distal bicep rupture. Bicipital repair performed on 02/04/15. Pt reports left elbow was immobilized in a splint for 5 weeks. No other rehab prior to today.       Present Symptoms: intermittent moderate pain of left elbow, wrist, shoulder     Aggravating Factors: lifting weight, lifting his children, dressing    Relieving Factors: ibuprofen   Pain level: 1-2/10 presently, 5/10 worst, 1/10 best      Dominant Side: right  Past Medical History:   Past Medical History   Diagnosis Date   ??? Left wrist pain      Current Medications:   Current Outpatient Prescriptions:   ???  NOREL AD 4-10-325 mg tab, TAKE 1 TABLET BY MOUTH TWICE A DAY, Disp: , Rfl: 2  ???  ondansetron (ZOFRAN ODT) 4 mg disintegrating tablet, TAKE 1 TABLET(S) UNDER THE TONGUE EVERY 4-6 HOURS AS NEEDED, Disp: , Rfl: 0  ???  ibuprofen 200 mg cap, Take  by mouth as needed., Disp: , Rfl:   ???  omega-3 fatty acids-vitamin e (FISH OIL) 1,000 mg cap, Take 1 Cap by mouth daily., Disp: , Rfl:   ???  clorazepate (TRANXENE) 3.75 mg tablet, Take 1 Tab by mouth two (2) times a day. Max Daily Amount: 7.5 mg. (Patient taking differently: Take 3.75 mg by mouth as needed for Sleep.), Disp: 36 Tab, Rfl: 2  ???  valACYclovir (VALTREX) 500 mg tablet, Take 1 Tab by mouth as needed. 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  Each section is scored on a 1-5 scale, 5 representing the greatest disability.  The scores of each section are added together for a total score of 55.    Observation/Orthostatic Postural Assessment:      no abnormalities  Palpation:       Surgical site healed, scar distal of cubital fossa    ROM:   Shoulder ROM  DATE  11.29.16 DATE  04/19/15   flexion R: WNL   L:  WNL  R:   L:    abd R: WNL  L:  WNL R:   L:    External rotation  R:  WFL   L:  WFL R:   L:    Internal rotation R: WFL   L: WFL R:   L:    Elbow flexion  R: 135  L:  135 R: 135  L: 135   Elbow extension  R: 0  L: -15 R: 0  L: 0     Strength:    DATE  03/15/15 DATE  04/19/15   Shoulder flexion R: 5  L:  4- R: 5  L: 5   Shoulder abd R: 5  L: 4 R: 5  L: 5   shoulder External rotation  R: 5  L: 4+ R: 5  L: 5   shoulder Internal rotation R: 5  L: 4+ R: 5  L: 5   Elbow flexion  R: 5  L:  4- R: 5  L: 4+   Elbow extension  R: 5  L: 4+ R: 5  L: 5   forearm Pronation R: 5  L: 4 R: 5  L: 4   forearm Supination  R: 5  L: 4-  R:5  L: 4   Wrist flexion  R: 5  L: 4+ R:5  L:5   Wrist extension  R:5   L: 4+ R:5  L: 5     Special Tests: N/A  Neurological Screen:   Myotomes: normal                  Dermatomes: normal                  Reflexes: not tested                  Neural Tension Tests: not tested   Functional Mobility:      difficulty with lifting using left UE   Balance:      Good   TREATMENT:    (In addition to Assessment/Re-Assessment sessions the following treatments were rendered)  THERAPEUTIC EXERCISE: (20 minutes):  Exercises per grid below to improve mobility and strength.  Required moderate visual, verbal and manual cues to promote proper body alignment, promote proper body posture, promote proper body mechanics and promote proper body breathing techniques.  Progressed resistance, range and repetitions as indicated.   03/31/15 04/05/15 04/07/15 04/19/15 04/28/15 05/04/15 05/10/15 05/12/15   Activity/Exercise           UBE   6 min level 3  4/4 min level 3  4/4 min level 5 4/4 min level 5 4/4 min level 5  4/4 level 5  4/4 level 5 4/4 level 5    Wrist extension/flex   3# 2 x 10   4# x 20                 Wrist supination/pronation    3# 2 x 10   4# x 20    5# x 10    Rows    13# cable 2 x 10  13# cable 2 x 10  17# cable 2 x 10     17# cable 2 x 10     Shoulder extension   13# cable 2 x 10           Shoulder ER/IR             Wrist extensor stretching            Shoulder flexion 3# 2 x 10  3# x 10      3# 2 x 10  supinated 4# x 10    shoulder abd  3 # 2 x 10  3# x 10      3# 2 x 10     Shoulder scaption  3# x 10 (above shoulder) 3# x 10      3# 2 x 10     Elbow flexion  Nuetral, pronated, supinated x 10 each, 3#  Nuetral, pronated, supinated x 10 each, 3#    Nuetral, pronated, supinated 2 x  10 each, 4#  Nuetral, pronated, supinated 2 x 10 each, 4#  Nuetral, pronated, supinated 2 x 10 each, 5# focusing on eccentric control  Nuetral, pronated, supinated 2 x 10 each, 5# focusing on eccentric control   tricep pull down    Cables x 7# each 2 x 10         AROM and strength testing     X 5 min        Chest press        13# 2 x 10     Overhead press         2 x 15                 Manual Therapy (   10 min   ):   ?? MFR to left wrist extensor muscles using deep eccentric release techniques     Therapeutic Modalities:   ?? [place] frequency 1.0, intensity 3.3, 8 min lateral elbow/forearm   ?? x 10 min cold back to left elbow and forearm                                                                                               HEP: As above; handouts given to patient for all exercises.  ______________________________________________________________________________________________________    Treatment Assessment:  Significant trigger points remain of wrist extensors. mild lateral elbow pain at end of session.      Next treatment appointment scheduled:  Future Appointments  Date Time Provider [place]   05/17/2015 7:45 AM Buren Kos, PT [facility]   05/19/2015 8:45 AM Buren Kos, PT [facility]   05/24/2015 7:45 AM Buren Kos, PT [facility]   05/26/2015 8:45 AM Buren Kos, PT [facility]     Progression/Medical Necessity:    ?? Patient is expected to demonstrate progress in strength and range of motion to decrease assistance required with recreational and home activities .  Compliance with Program/Exercises: Will assess as treatment progresses.   Reason for Continuation of Services/Other Comments:  ?? Patient continues to require modification of therapeutic interventions to increase complexity of exercises.  Recommendations/Intent for next treatment session: "Treatment next visit will focus on advancements to more challenging activities". AROM and PROM of left elbow, forearm and shoulder, scar mobilization, ice    Total Treatment Duration:  PT Patient Time In/Time Out  Time In: 0845  Time Out: [place], PT

## 2015-05-17 ENCOUNTER — Inpatient Hospital Stay: Admit: 2015-05-17 | Payer: BLUE CROSS/BLUE SHIELD | Primary: Nurse Practitioner

## 2015-05-17 NOTE — Progress Notes (Signed)
Ronald Fields.   (DOB:Apr 10, 1976) [place] at   [facility]  [address], [place]  [phone]   [phone]         Outpatient PHYSICAL THERAPY: Daily Note and Recertification 3/47/4259      Fall Risk Score: 1 (? 5 = High Risk)    ICD-10: Treatment Diagnosis:  Stiffness in joint; left elbow (M25.522), stiffness of joint; left wrist (M25.532), pain in limb, unspecified, arm (M79.602)     REFERRING PHYSICIAN: Maryclare Bean, MD  MD Orders: evaluate and treat  Return Physician Appointment: 05/31/15   MEDICAL/REFERRING DIAGNOSIS:(S46.212A) strain of muscle, fascia and tendon of other parts of biceps, left arm   DATE OF ONSET: surgery: 02/04/15    PRIOR LEVEL OF FUNCTION: independent, active  PRECAUTIONS/ALLERGIES: No Known Allergies    ASSESSMENT:  ????????This section established at most recent assessment??????????  Ronald Fields. has attended 13 scheduled PT visits s/p left biceps repair.  He is progressing well with all aspects but continues to be challenged with functional tasks due to mild weakness of bicep as well as intermittent pain of left forearm. He would highly benefit from continued PT for 2-4 more weeks to reach discharge goals.      PROBLEM LIST (Impairments causing functional limitations):  1. Decreased Strength affecting function  2. Decreased ADL/Functional Activities  3. Decreased Flexibility/joint mobility  4. Increased Pain affecting function  5. Other decreased knowledge of HEP  GOALS: (Goals have been discussed and agreed upon with patient.)  SHORT-TERM FUNCTIONAL GOALS:    1. Pt will be independent with HEP.(met)   2. Pt will report left UE pain does not exceed 2/10 during functional activities. (ongoing)  3. Pt will improve left elbow extension by 10 degrees or greater to improve functional mobility. (met)   DISCHARGE GOALS: Time Frame: 6-8 week  1. Pt will demonstrate 5/5 strength forearm and elbow strength all  planes.(ongoing)  2. Pt will report no limitations from left UE pain or stiffness during recreational and home activities. (ongoing)  3. Pt will demonstrate DASH score improvement by at least 10 points. (ongoing)    REHABILITATION POTENTIAL FOR STATED GOALS: GoodPLAN OF CARE:  INTERVENTIONS PLANNED: (Benefits and precautions of physical therapy have been discussed with the patient.)  1. cold  2. heat  3. home exercise program (HEP)  4. manual therapy  5. neuromuscular re-education/strengthening  6. range of motion: active/assisted/passive  7. therapeutic activities  8. therapeutic exercise/strengthening  TREATMENT PLAN EFFECTIVE DATES: 05/17/15- 06/24/15  FREQUENCY/DURATION: Follow patient 2 times a week for 2-4 more weeks to address above goals.  Regarding Deylan Canterbury Jr.'s therapy, I certify that the treatment plan above will be carried out by a therapist or under their direction.  Thank you for this referral,  Buren Kos, PT     Referring Physician Signature: Maryclare Bean, MD          Date                       SUBJECTIVE:  05/17/2015   Pt states that he got relief of forearm pain after last session. He reports he feels like he is getting better and better everyday.     History of Present Injury/Illness (Reason for Referral):   Pt states that on 01/28/15 he felt left bicep pain while lifting weights.  He did not alter any activities at that time and played full game of rugby.  Over the next few days, he experienced significant arm pain and elbow restriction.  Went to urgent care and was then referred to orthopedist.  Dx with distal bicep rupture. Bicipital repair performed on 02/04/15. Pt reports left elbow was immobilized in a splint for 5 weeks. No other rehab prior to today.      Present Symptoms: intermittent moderate pain of left elbow, wrist, shoulder     Aggravating Factors: lifting weight, lifting his children, dressing    Relieving Factors: ibuprofen    Pain level: 1-2/10 presently, 5/10 worst, 1/10 best      Dominant Side: right  Past Medical History:   Past Medical History   Diagnosis Date   ??? Left wrist pain      Current Medications:   Current Outpatient Prescriptions:   ???  NOREL AD 4-10-325 mg tab, TAKE 1 TABLET BY MOUTH TWICE A DAY, Disp: , Rfl: 2  ???  ondansetron (ZOFRAN ODT) 4 mg disintegrating tablet, TAKE 1 TABLET(S) UNDER THE TONGUE EVERY 4-6 HOURS AS NEEDED, Disp: , Rfl: 0  ???  ibuprofen 200 mg cap, Take  by mouth as needed., Disp: , Rfl:   ???  omega-3 fatty acids-vitamin e (FISH OIL) 1,000 mg cap, Take 1 Cap by mouth daily., Disp: , Rfl:   ???  clorazepate (TRANXENE) 3.75 mg tablet, Take 1 Tab by mouth two (2) times a day. Max Daily Amount: 7.5 mg. (Patient taking differently: Take 3.75 mg by mouth as needed for Sleep.), Disp: 36 Tab, Rfl: 2  ???  valACYclovir (VALTREX) 500 mg tablet, Take 1 Tab by mouth as needed. 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  Each section is scored on a 1-5 scale, 5 representing the greatest disability.  The scores of each section are added together for a total score of 55.    Observation/Orthostatic Postural Assessment:      no abnormalities  Palpation:      Surgical site healed   ROM:   Shoulder ROM  DATE  11.29.16 DATE  04/19/15   flexion R: WNL   L:  WNL  R:   L:    abd R: WNL  L:  WNL R:   L:    External rotation  R:  WFL   L:  WFL R:   L:     Internal rotation R: WFL   L: WFL R:   L:    Elbow flexion  R: 135  L:  135 R: 135  L: 135   Elbow extension  R: 0  L: -15 R: 0  L: 0     Strength:    DATE  03/15/15 DATE  04/19/15   Shoulder flexion R: 5  L: 4- R: 5  L: 5   Shoulder abd R: 5  L: 4 R: 5  L: 5   shoulder External rotation  R: 5  L:  4+ R: 5  L: 5   shoulder Internal rotation R: 5  L: 4+ R: 5  L: 5   Elbow flexion  R: 5  L:  4- R: 5  L: 4+   Elbow extension  R: 5  L: 4+ R: 5  L: 5   forearm Pronation R: 5  L: 4 R: 5  L: 5   forearm Supination  R: 5  L: 4-  R:5  L: 4   Wrist flexion  R: 5  L: 4+ R:5  L:5   Wrist extension  R:5   L: 4+ R:5  L: 5     Functional Mobility:      difficulty with lifting moderately heavy object/kids using left UE   Balance:      Good   TREATMENT:    (In addition to Assessment/Re-Assessment sessions the following treatments were rendered)  THERAPEUTIC EXERCISE: (25 minutes):  Exercises per grid below to improve mobility and strength.  Required moderate visual, verbal and manual cues to promote proper body alignment, promote proper body posture, promote proper body mechanics and promote proper body breathing techniques.  Progressed resistance, range and repetitions as indicated.   04/05/15 04/07/15 04/19/15 04/28/15 05/04/15 05/10/15 05/12/15 05/17/15   Activity/Exercise           UBE   4/4 min level 3  4/4 min level 5 4/4 min level 5 4/4 min level 5  4/4 level 5  4/4 level 5 4/4 level 5  4/4 level 5   Wrist extension/flex  3# 2 x 10   4# x 20                  Wrist supination/pronation   3# 2 x 10   4# x 20    5# x 10     Rows   13# cable 2 x 10  17# cable 2 x 10     17# cable 2 x 10   17# cable 2 x 15    Shoulder extension          10# cables x 15    Shoulder ER/IR             Wrist extensor stretching            Shoulder flexion 3# x 10      3# 2 x 10  supinated 4# x 10  5# x 10   shoulder abd  3# x 10      3# 2 x 10      Shoulder scaption  3# x 10      3# 2 x 10       Elbow flexion  Nuetral, pronated, supinated x 10 each, 3#    Nuetral, pronated, supinated 2 x 10 each, 4#  Nuetral, pronated, supinated 2 x 10 each, 4#  Nuetral, pronated, supinated 2 x 10 each, 5# focusing on eccentric control  Nuetral, pronated, supinated 2 x 10 each, 5# focusing on eccentric control Nuetral, pronated, supinated 2 x 10 each, 5# focusing on eccentric control   tricep pull down   Cables x 7# each 2 x 10          AROM and strength testing    X 5 min         Chest press       13# 2 x 10      Overhead press        2 x 15  Manual Therapy (   5 min   ):   ?? MFR to left wrist extensor muscles using deep eccentric release techniques     Therapeutic Modalities:   ?? [place] frequency 1.0, intensity 3.3, 8 min lateral elbow/forearm   ?? x 10 min cold back to left elbow and forearm                                                                                               HEP: As above; handouts given to patient for all exercises.  ______________________________________________________________________________________________________    Treatment Assessment:  Significant trigger points remain of wrist extensors. mild lateral elbow pain at end of session.      Next treatment appointment scheduled:  Future Appointments  Date Time Provider [place]   05/19/2015 8:45 AM Buren Kos, PT [facility]   05/24/2015 7:45 AM Buren Kos, PT [facility]   05/26/2015 8:45 AM Buren Kos, PT [facility]     Progression/Medical Necessity:   ?? Patient is expected to demonstrate progress in strength and range of motion to decrease assistance required with recreational and home activities .  Compliance with Program/Exercises: Will assess as treatment progresses.   Reason for Continuation of Services/Other Comments:  ?? Patient continues to require modification of therapeutic interventions to increase complexity of exercises.   Recommendations/Intent for next treatment session: "Treatment next visit will focus on advancements to more challenging activities". AROM and PROM of left elbow, forearm and shoulder, scar mobilization, ice    Total Treatment Duration:  PT Patient Time In/Time Out  Time In: 0745  Time Out: 0830    Buren Kos, PT

## 2015-05-19 ENCOUNTER — Inpatient Hospital Stay: Admit: 2015-05-19 | Payer: BLUE CROSS/BLUE SHIELD | Primary: Nurse Practitioner

## 2015-05-19 DIAGNOSIS — M79602 Pain in left arm: Secondary | ICD-10-CM

## 2015-05-19 NOTE — Progress Notes (Signed)
Ronald Fields.   (DOB:1975/11/04) [facility] at   [facility]  [address], [place]  [phone]   [phone]         Outpatient PHYSICAL THERAPY: Daily Note 05/19/2015      Fall Risk Score: 1 (? 5 = High Risk)    ICD-10: Treatment Diagnosis:  Stiffness in joint; left elbow (M25.522), stiffness of joint; left wrist (M25.532), pain in limb, unspecified, arm (M79.602)     REFERRING PHYSICIAN: Maryclare Bean, MD  MD Orders: evaluate and treat  Return Physician Appointment: 05/31/15   MEDICAL/REFERRING DIAGNOSIS:(S46.212A) strain of muscle, fascia and tendon of other parts of biceps, left arm   DATE OF ONSET: surgery: 02/04/15    PRIOR LEVEL OF FUNCTION: independent, active  PRECAUTIONS/ALLERGIES: No Known Allergies    ASSESSMENT:  ????????This section established at most recent assessment??????????  Ronald Fields. has attended 13 scheduled PT visits s/p left biceps repair.  He is progressing well with all aspects but continues to be challenged with functional tasks due to mild weakness of bicep as well as intermittent pain of left forearm. He would highly benefit from continued PT for 2-4 more weeks to reach discharge goals.      PROBLEM LIST (Impairments causing functional limitations):  1. Decreased Strength affecting function  2. Decreased ADL/Functional Activities  3. Decreased Flexibility/joint mobility  4. Increased Pain affecting function  5. Other decreased knowledge of HEP  GOALS: (Goals have been discussed and agreed upon with patient.)  SHORT-TERM FUNCTIONAL GOALS:    1. Pt will be independent with HEP.(met)   2. Pt will report left UE pain does not exceed 2/10 during functional activities. (ongoing)  3. Pt will improve left elbow extension by 10 degrees or greater to improve functional mobility. (met)   DISCHARGE GOALS: Time Frame: 6-8 week  1. Pt will demonstrate 5/5 strength forearm and elbow strength all planes.(ongoing)   2. Pt will report no limitations from left UE pain or stiffness during recreational and home activities. (ongoing)  3. Pt will demonstrate DASH score improvement by at least 10 points. (ongoing)    REHABILITATION POTENTIAL FOR STATED GOALS: GoodPLAN OF CARE:  INTERVENTIONS PLANNED: (Benefits and precautions of physical therapy have been discussed with the patient.)  1. cold  2. heat  3. home exercise program (HEP)  4. manual therapy  5. neuromuscular re-education/strengthening  6. range of motion: active/assisted/passive  7. therapeutic activities  8. therapeutic exercise/strengthening  TREATMENT PLAN EFFECTIVE DATES: 05/17/15- 06/24/15  FREQUENCY/DURATION: Follow patient 2 times a week for 2-4 more weeks to address above goals.  Regarding Ronald Mishkin Jr.'s therapy, I certify that the treatment plan above will be carried out by a therapist or under their direction.  Thank you for this referral,  Buren Kos, PT     Referring Physician Signature: Maryclare Bean, MD          Date                       SUBJECTIVE:  05/19/2015   Intermittent elbow pain continues after activities but less intense. 3-4/10 curerntly     History of Present Injury/Illness (Reason for Referral):   Pt states that on 01/28/15 he felt left bicep pain while lifting weights.  He did not alter any activities at that time and played full game of rugby.  Over the next few days, he experienced significant arm pain and elbow restriction.  [facility]  to urgent care and was then referred to orthopedist.  Dx with distal bicep rupture. Bicipital repair performed on 02/04/15. Pt reports left elbow was immobilized in a splint for 5 weeks. No other rehab prior to today.      Present Symptoms: intermittent moderate pain of left elbow, wrist, shoulder     Aggravating Factors: lifting weight, lifting his children, dressing    Relieving Factors: ibuprofen   Pain level: 1-2/10 presently, 5/10 worst, 1/10 best      Dominant Side: right   Past Medical History:   Past Medical History   Diagnosis Date   ??? Left wrist pain      Current Medications:   Current Outpatient Prescriptions:   ???  NOREL AD 4-10-325 mg tab, TAKE 1 TABLET BY MOUTH TWICE A DAY, Disp: , Rfl: 2  ???  ondansetron (ZOFRAN ODT) 4 mg disintegrating tablet, TAKE 1 TABLET(S) UNDER THE TONGUE EVERY 4-6 HOURS AS NEEDED, Disp: , Rfl: 0  ???  ibuprofen 200 mg cap, Take  by mouth as needed., Disp: , Rfl:   ???  omega-3 fatty acids-vitamin e (FISH OIL) 1,000 mg cap, Take 1 Cap by mouth daily., Disp: , Rfl:   ???  clorazepate (TRANXENE) 3.75 mg tablet, Take 1 Tab by mouth two (2) times a day. Max Daily Amount: 7.5 mg. (Patient taking differently: Take 3.75 mg by mouth as needed for Sleep.), Disp: 36 Tab, Rfl: 2  ???  valACYclovir (VALTREX) 500 mg tablet, Take 1 Tab by mouth as needed. 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  Each section is scored on a 1-5 scale, 5 representing the greatest disability.  The scores of each section are added together for a total score of 55.    Observation/Orthostatic Postural Assessment:      no abnormalities  Palpation:      Surgical site healed   ROM:   Shoulder ROM  DATE  11.29.16 DATE  04/19/15   flexion R: WNL   L:  WNL  R:   L:    abd R: WNL  L:  WNL R:   L:    External rotation  R:  WFL   L:  WFL R:   L:    Internal rotation R: WFL   L: WFL R:   L:    Elbow flexion  R: 135  L:  135 R: 135   L: 135   Elbow extension  R: 0  L: -15 R: 0  L: 0     Strength:    DATE  03/15/15 DATE  04/19/15   Shoulder flexion R: 5  L: 4- R: 5  L: 5   Shoulder abd R: 5  L: 4 R: 5  L: 5   shoulder External rotation  R: 5  L: 4+ R: 5  L: 5   shoulder Internal rotation R: 5  L:  4+ R: 5  L: 5   Elbow flexion  R: 5  L:  4- R: 5  L: 4+   Elbow extension  R: 5  L: 4+ R: 5  L: 5   forearm Pronation R: 5  L: 4 R: 5  L: 5   forearm Supination  R: 5  L: 4-  R:5  L: 4   Wrist flexion  R: 5  L: 4+ R:5  L:5   Wrist extension  R:5   L: 4+ R:5  L: 5     Functional Mobility:      difficulty with lifting moderately heavy object/kids using left UE   Balance:      Good   TREATMENT:    (In addition to Assessment/Re-Assessment sessions the following treatments were rendered)  THERAPEUTIC EXERCISE: (8 minutes):  Exercises per grid below to improve mobility and strength.  Required moderate visual, verbal and manual cues to promote proper body alignment, promote proper body posture, promote proper body mechanics and promote proper body breathing techniques.  Progressed resistance, range and repetitions as indicated.   04/05/15 04/07/15 04/19/15 04/28/15 05/04/15 05/10/15 05/12/15 05/17/15 05/19/15   Activity/Exercise            UBE   4/4 min level 3  4/4 min level 5 4/4 min level 5 4/4 min level 5  4/4 level 5  4/4 level 5 4/4 level 5  4/4 level 5 4/4 level 6   Wrist extension/flex  3# 2 x 10   4# x 20                    Wrist supination/pronation   3# 2 x 10   4# x 20    5# x 10      Rows   13# cable 2 x 10  17# cable 2 x 10     17# cable 2 x 10   17# cable 2 x 15     Shoulder extension          10# cables x 15      Shoulder ER/IR              Wrist extensor stretching             Shoulder flexion 3# x 10      3# 2 x 10  supinated 4# x 10  5# x 10    shoulder abd  3# x 10      3# 2 x 10       Shoulder scaption  3# x 10      3# 2 x 10       Elbow flexion  Nuetral, pronated, supinated x 10 each, 3#    Nuetral,  pronated, supinated 2 x 10 each, 4#  Nuetral, pronated, supinated 2 x 10 each, 4#  Nuetral, pronated, supinated 2 x 10 each, 5# focusing on eccentric control  Nuetral, pronated, supinated 2 x 10 each, 5# focusing on eccentric control Nuetral, pronated, supinated 2 x 10 each, 5# focusing on eccentric control    tricep pull down   Cables x 7# each 2 x 10           AROM and strength testing    X 5 min          Chest press       13# 2 x 10       Overhead press  2 x 15                    Manual Therapy (   30 min   ):   ?? MFR to left wrist extensor muscles using deep eccentric release techniques   ?? AP and PA's of ulna and radial heads (prox)     Therapeutic Modalities:   ?? x 10 min cold back to left elbow and forearm                                                                                               HEP: As above; handouts given to patient for all exercises.  ______________________________________________________________________________________________________    Treatment Assessment:  Mild soreness of lateral forearm after manual       Next treatment appointment scheduled:  Future Appointments  Date Time Provider [place]   05/24/2015 7:45 AM Buren Kos, PT [facility]   05/26/2015 8:45 AM Buren Kos, PT [facility]     Progression/Medical Necessity:   ?? Patient is expected to demonstrate progress in strength and range of motion to decrease assistance required with recreational and home activities .  Compliance with Program/Exercises: Will assess as treatment progresses.   Reason for Continuation of Services/Other Comments:  ?? Patient continues to require modification of therapeutic interventions to increase complexity of exercises.  Recommendations/Intent for next treatment session: "Treatment next visit will focus on advancements to more challenging activities". AROM and PROM of left elbow, forearm and shoulder, scar mobilization, ice    Total Treatment Duration:   PT Patient Time In/Time Out  Time In: 0845  Time Out: [place], PT

## 2015-05-24 NOTE — Progress Notes (Signed)
Ronald Fields.  DOB: 01/26/1976 [facility] Center at [facility]  [address], [address], [place] [address]  Phone:[phone]   Fax:[phone]        OUTPATIENT DAILY NOTE    NAME/AGE/GENDER: Ronald Fields. is a 40 y.o. male.     DATE: 05/24/2015    Patient cancelled for appointment today due to work conflict.  Will plan to follow up on next scheduled visit.    Wilber Oliphant, PT

## 2015-05-25 ENCOUNTER — Inpatient Hospital Stay: Payer: BLUE CROSS/BLUE SHIELD | Primary: Nurse Practitioner

## 2015-05-26 ENCOUNTER — Inpatient Hospital Stay: Admit: 2015-05-26 | Payer: BLUE CROSS/BLUE SHIELD | Primary: Nurse Practitioner

## 2015-05-26 NOTE — Progress Notes (Signed)
Ronald Fields.   (DOB:1975-11-29) Therapy Center at   [facility]  [address], [address], [place]  Phone:[phone]   [phone]         Outpatient PHYSICAL THERAPY: Daily Note 05/26/2015      Fall Risk Score: 1 (? 5 = High Risk)    ICD-10: Treatment Diagnosis:  Stiffness in joint; left elbow (M25.522), stiffness of joint; left wrist (M25.532), pain in limb, unspecified, arm (M79.602)     REFERRING PHYSICIAN: Maryclare Bean, MD  MD Orders: evaluate and treat  Return Physician Appointment: 05/31/15   MEDICAL/REFERRING DIAGNOSIS:(S46.212A) strain of muscle, fascia and tendon of other parts of biceps, left arm   DATE OF ONSET: surgery: 02/04/15    PRIOR LEVEL OF FUNCTION: independent, active  PRECAUTIONS/ALLERGIES: No Known Allergies    ASSESSMENT:  ????????This section established at most recent assessment??????????  Ronald Fields. has attended 13 scheduled PT visits s/p left biceps repair.  He is progressing well with all aspects but continues to be challenged with functional tasks due to mild weakness of bicep as well as intermittent pain of left forearm. He would highly benefit from continued PT for 2-4 more weeks to reach discharge goals.      PROBLEM LIST (Impairments causing functional limitations):  1. Decreased Strength affecting function  2. Decreased ADL/Functional Activities  3. Decreased Flexibility/joint mobility  4. Increased Pain affecting function  5. Other decreased knowledge of HEP  GOALS: (Goals have been discussed and agreed upon with patient.)  SHORT-TERM FUNCTIONAL GOALS:    1. Pt will be independent with HEP.(met)   2. Pt will report left UE pain does not exceed 2/10 during functional activities. (ongoing)  3. Pt will improve left elbow extension by 10 degrees or greater to improve functional mobility. (met)   DISCHARGE GOALS: Time Frame: 6-8 week  1. Pt will demonstrate 5/5 strength forearm and elbow strength all planes.(ongoing)   2. Pt will report no limitations from left UE pain or stiffness during recreational and home activities. (ongoing)  3. Pt will demonstrate DASH score improvement by at least 10 points. (ongoing)    REHABILITATION POTENTIAL FOR STATED GOALS: GoodPLAN OF CARE:  INTERVENTIONS PLANNED: (Benefits and precautions of physical therapy have been discussed with the patient.)  1. cold  2. heat  3. home exercise program (HEP)  4. manual therapy  5. neuromuscular re-education/strengthening  6. range of motion: active/assisted/passive  7. therapeutic activities  8. therapeutic exercise/strengthening  TREATMENT PLAN EFFECTIVE DATES: 05/17/15- 06/24/15  FREQUENCY/DURATION: Follow patient 2 times a week for 2-4 more weeks to address above goals.  Regarding Ronald Zayed Jr.'s therapy, I certify that the treatment plan above will be carried out by a therapist or under their direction.  Thank you for this referral,  Buren Kos, PT     Referring Physician Signature: Maryclare Bean, MD          Date                       SUBJECTIVE:  05/26/2015   Pt states that he still has localized forearm pain. Less fatigue of bicep overall.       History of Present Injury/Illness (Reason for Referral):   Pt states that on 01/28/15 he felt left bicep pain while lifting weights.  He did not alter any activities at that time and played full game of rugby.  Over the next few days, he experienced significant arm pain  and elbow restriction.  Went to urgent care and was then referred to orthopedist.  Dx with distal bicep rupture. Bicipital repair performed on 02/04/15. Pt reports left elbow was immobilized in a splint for 5 weeks. No other rehab prior to today.      Present Symptoms: intermittent moderate pain of left elbow, wrist, shoulder     Aggravating Factors: lifting weight, lifting his children, dressing    Relieving Factors: ibuprofen   Pain level: 1-2/10 presently, 5/10 worst, 1/10 best      Dominant Side: right   Past Medical History:   Past Medical History   Diagnosis Date   ??? Left wrist pain      Current Medications:   Current Outpatient Prescriptions:   ???  NOREL AD 4-10-325 mg tab, TAKE 1 TABLET BY MOUTH TWICE A DAY, Disp: , Rfl: 2  ???  ondansetron (ZOFRAN ODT) 4 mg disintegrating tablet, TAKE 1 TABLET(S) UNDER THE TONGUE EVERY 4-6 HOURS AS NEEDED, Disp: , Rfl: 0  ???  ibuprofen 200 mg cap, Take  by mouth as needed., Disp: , Rfl:   ???  omega-3 fatty acids-vitamin e (FISH OIL) 1,000 mg cap, Take 1 Cap by mouth daily., Disp: , Rfl:   ???  clorazepate (TRANXENE) 3.75 mg tablet, Take 1 Tab by mouth two (2) times a day. Max Daily Amount: 7.5 mg. (Patient taking differently: Take 3.75 mg by mouth as needed for Sleep.), Disp: 36 Tab, Rfl: 2  ???  valACYclovir (VALTREX) 500 mg tablet, Take 1 Tab by mouth as needed. 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  Each section is scored on a 1-5 scale, 5 representing the greatest disability.  The scores of each section are added together for a total score of 55.    Observation/Orthostatic Postural Assessment:      no abnormalities  Palpation:      Surgical site healed   ROM:   Shoulder ROM  DATE  11.29.16 DATE  04/19/15   flexion R: WNL   L:  WNL  R:   L:    abd R: WNL  L:  WNL R:   L:    External rotation  R:  WFL   L:  WFL R:   L:    Internal rotation R: WFL   L: WFL R:   L:    Elbow flexion  R: 135  L:  135 R: 135   L: 135   Elbow extension  R: 0  L: -15 R: 0  L: 0     Strength:    DATE  03/15/15 DATE  04/19/15   Shoulder flexion R: 5  L: 4- R: 5  L: 5   Shoulder abd R: 5  L: 4 R: 5  L: 5   shoulder External rotation  R: 5  L: 4+ R: 5  L: 5   shoulder Internal  rotation R: 5  L: 4+ R: 5  L: 5   Elbow flexion  R: 5  L:  4- R: 5  L: 4+   Elbow extension  R: 5  L: 4+ R: 5  L: 5   forearm Pronation R: 5  L: 4 R: 5  L: 5   forearm Supination  R: 5  L: 4-  R:5  L: 4   Wrist flexion  R: 5  L: 4+ R:5  L:5   Wrist extension  R:5   L: 4+ R:5  L: 5     Functional Mobility:      difficulty with lifting moderately heavy object/kids using left UE   Balance:      Good   TREATMENT:    (In addition to Assessment/Re-Assessment sessions the following treatments were rendered)  THERAPEUTIC EXERCISE: (30 minutes):  Exercises per grid below to improve mobility and strength.  Required moderate visual, verbal and manual cues to promote proper body alignment, promote proper body posture, promote proper body mechanics and promote proper body breathing techniques.  Progressed resistance, range and repetitions as indicated.   04/05/15 04/07/15 04/19/15 04/28/15 05/04/15 05/10/15 05/12/15 05/17/15 05/19/15 05/26/15   Activity/Exercise             UBE   4/4 min level 3  4/4 min level 5 4/4 min level 5 4/4 min level 5  4/4 level 5  4/4 level 5 4/4 level 5  4/4 level 5 4/4 level 6 4/4 level 6   Wrist extension/flex  3# 2 x 10   4# x 20                      Wrist supination/pronation   3# 2 x 10   4# x 20    5# x 10       Rows   13# cable 2 x 10  17# cable 2 x 10     17# cable 2 x 10   17# cable 2 x 15   17# cable 2 x 15    Shoulder extension          10# cables x 15       Shoulder ER/IR               Wrist extensor stretching              Shoulder flexion 3# x 10      3# 2 x 10  supinated 4# x 10  5# x 10     shoulder abd  3# x 10      3# 2 x 10        Shoulder scaption  3# x 10      3# 2 x 10        Elbow flexion  Nuetral, pronated, supinated x 10 each, 3#    Nuetral,  pronated, supinated 2 x 10 each, 4#  Nuetral, pronated, supinated 2 x 10 each, 4#  Nuetral, pronated, supinated 2 x 10 each, 5# focusing on eccentric control  Nuetral, pronated, supinated 2 x 10 each, 5# focusing on eccentric control Nuetral, pronated, supinated 2 x 10 each, 5# focusing on eccentric control  Cables 7# 2 x 15    tricep pull down   Cables x 7# each 2 x 10         7# 2 x 10 cables    AROM and strength testing    X 5  min           Chest press       13# 2 x 10        Overhead press        2 x 15       rebounder           Overhand throw green ball 2 x 20    Ball toss           Yellow ball 2 x 20                   Manual Therapy (   15 min   ):   ?? MFR to left wrist extensor muscles using deep eccentric release techniques   ?? AP and PA's of ulna and radial heads (prox)     Therapeutic Modalities:   ?? x 10 min cold back to left elbow and forearm                                                                                               HEP: As above; handouts given to patient for all exercises.  ______________________________________________________________________________________________________    Treatment Assessment:  Mild soreness of lateral forearm after manual       Next treatment appointment scheduled:  No future appointments.  Progression/Medical Necessity:   ?? Patient is expected to demonstrate progress in strength and range of motion to decrease assistance required with recreational and home activities .  Compliance with Program/Exercises: Will assess as treatment progresses.   Reason for Continuation of Services/Other Comments:  ?? Patient continues to require modification of therapeutic interventions to increase complexity of exercises.  Recommendations/Intent for next treatment session: "Treatment next visit will focus on advancements to more challenging activities". AROM and PROM of left elbow, forearm and shoulder, scar mobilization, ice    Total Treatment Duration:   PT Patient Time In/Time Out  Time In: 0845  Time Out: [place], PT

## 2015-06-01 NOTE — Progress Notes (Signed)
Ronald Fields.  DOB: 21-Aug-1975 Therapy Center at [facility]  [address], [place] [address]  Phone:[phone]   Fax:[phone]        OUTPATIENT DAILY NOTE    NAME/AGE/GENDER: Ronald Fields. is a 40 y.o. male.     DATE: 06/01/2015    Patient no showed for appointment today.  Will plan to follow up on next scheduled visit.    Wilber Oliphant, PT

## 2015-06-02 ENCOUNTER — Inpatient Hospital Stay: Payer: BLUE CROSS/BLUE SHIELD | Primary: Nurse Practitioner

## 2015-06-07 ENCOUNTER — Inpatient Hospital Stay: Admit: 2015-06-07 | Payer: BLUE CROSS/BLUE SHIELD | Primary: Nurse Practitioner

## 2015-06-07 NOTE — Progress Notes (Signed)
Ronald Fields.   (DOB:25-May-1975) [place] [facility]   [facility]  [address], [place]  Phone:[phone]   [phone]         Outpatient PHYSICAL THERAPY: Daily Note 06/07/2015      Fall Risk Score: 1 (? 5 = High Risk)    ICD-10: Treatment Diagnosis:  Stiffness in joint; left elbow (M25.522), stiffness of joint; left wrist (M25.532), pain in limb, unspecified, arm (M79.602)     REFERRING PHYSICIAN: Maryclare Bean, MD  MD Orders: evaluate and treat  Return Physician Appointment: 05/31/15   MEDICAL/REFERRING DIAGNOSIS:(S46.212A) strain of muscle, fascia and tendon of other parts of biceps, left arm   DATE OF ONSET: surgery: 02/04/15    PRIOR LEVEL OF FUNCTION: independent, active  PRECAUTIONS/ALLERGIES: No Known Allergies    ASSESSMENT:  ????????This section established at most recent assessment??????????  Ronald Fields. has attended 13 scheduled PT visits s/p left biceps repair.  He is progressing well with all aspects but continues to be challenged with functional tasks due to mild weakness of bicep as well as intermittent pain of left forearm. He would highly benefit from continued PT for 2-4 more weeks to reach discharge goals.      PROBLEM LIST (Impairments causing functional limitations):  1. Decreased Strength affecting function  2. Decreased ADL/Functional Activities  3. Decreased Flexibility/joint mobility  4. Increased Pain affecting function  5. Other decreased knowledge of HEP  GOALS: (Goals have been discussed and agreed upon with patient.)  SHORT-TERM FUNCTIONAL GOALS:    1. Pt will be independent with HEP.(met)   2. Pt will report left UE pain does not exceed 2/10 during functional activities. (ongoing)  3. Pt will improve left elbow extension by 10 degrees or greater to improve functional mobility. (met)   DISCHARGE GOALS: Time Frame: 6-8 week  1. Pt will demonstrate 5/5 strength forearm and elbow strength all planes.(ongoing)   2. Pt will report no limitations from left UE pain or stiffness during recreational and home activities. (ongoing)  3. Pt will demonstrate DASH score improvement by at least 10 points. (ongoing)    REHABILITATION POTENTIAL FOR STATED GOALS: GoodPLAN OF CARE:  INTERVENTIONS PLANNED: (Benefits and precautions of physical therapy have been discussed with the patient.)  1. cold  2. heat  3. home exercise program (HEP)  4. manual therapy  5. neuromuscular re-education/strengthening  6. range of motion: active/assisted/passive  7. therapeutic activities  8. therapeutic exercise/strengthening  TREATMENT PLAN EFFECTIVE DATES: 05/17/15- 06/24/15  FREQUENCY/DURATION: Follow patient 2 times a week for 2-4 more weeks to address above goals.  Regarding Ronald Penna Jr.'s therapy, I certify that the treatment plan above will be carried out by a therapist or under their direction.  Thank you for this referral,  Buren Kos, PT     Referring Physician Signature: Maryclare Bean, MD          Date                       SUBJECTIVE:  06/07/2015   Pt continues to report forearm pain and mild fatigue of bicep. He states that he saw MD few days ago and no additional f/u appointment scheduled.       History of Present Injury/Illness (Reason for Referral):   Pt states that on 01/28/15 he felt left bicep pain while lifting weights.  He did not alter any activities at that time and played full game of  rugby.  Over the next few days, he experienced significant arm pain and elbow restriction.  Went to urgent care and was then referred to orthopedist.  Dx with distal bicep rupture. Bicipital repair performed on 02/04/15. Pt reports left elbow was immobilized in a splint for 5 weeks. No other rehab prior to today.      Present Symptoms: intermittent moderate pain of left elbow, wrist, shoulder     Aggravating Factors: lifting weight, lifting his children, dressing    Relieving Factors: ibuprofen    Pain level: 1-2/10 presently, 5/10 worst, 1/10 best      Dominant Side: right  Past Medical History:   Past Medical History   Diagnosis Date   ??? Left wrist pain      Current Medications:   Current Outpatient Prescriptions:   ???  NOREL AD 4-10-325 mg tab, TAKE 1 TABLET BY MOUTH TWICE A DAY, Disp: , Rfl: 2  ???  ondansetron (ZOFRAN ODT) 4 mg disintegrating tablet, TAKE 1 TABLET(S) UNDER THE TONGUE EVERY 4-6 HOURS AS NEEDED, Disp: , Rfl: 0  ???  ibuprofen 200 mg cap, Take  by mouth as needed., Disp: , Rfl:   ???  omega-3 fatty acids-vitamin e (FISH OIL) 1,000 mg cap, Take 1 Cap by mouth daily., Disp: , Rfl:   ???  clorazepate (TRANXENE) 3.75 mg tablet, Take 1 Tab by mouth two (2) times a day. Max Daily Amount: 7.5 mg. (Patient taking differently: Take 3.75 mg by mouth as needed for Sleep.), Disp: 36 Tab, Rfl: 2  ???  valACYclovir (VALTREX) 500 mg tablet, Take 1 Tab by mouth as needed. 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  Each section is scored on a 1-5 scale, 5 representing the greatest disability.  The scores of each section are added together for a total score of 55.    Observation/Orthostatic Postural Assessment:      no abnormalities  Palpation:      Surgical site healed   ROM:   Shoulder ROM  DATE  11.29.16 DATE  04/19/15   flexion R: WNL   L:  WNL  R:   L:    abd R: WNL  L:  WNL R:   L:    External rotation  R:  WFL   L:  WFL R:   L:     Internal rotation R: WFL   L: WFL R:   L:    Elbow flexion  R: 135  L:  135 R: 135  L: 135   Elbow extension  R: 0  L: -15 R: 0  L: 0     Strength:    DATE  03/15/15 DATE  04/19/15   Shoulder flexion R: 5  L: 4- R: 5  L: 5   Shoulder abd R: 5  L: 4 R: 5  L: 5   shoulder External rotation  R: 5  L: 4+ R: 5  L: 5   shoulder Internal rotation R: 5  L: 4+ R: 5  L: 5   Elbow flexion  R: 5  L:  4- R: 5  L: 4+   Elbow extension  R: 5  L: 4+ R: 5  L: 5   forearm Pronation R: 5  L: 4 R: 5  L: 5   forearm Supination  R: 5  L: 4-  R:5  L: 4   Wrist flexion  R: 5  L: 4+ R:5  L:5   Wrist extension  R:5   L: 4+ R:5  L: 5     Functional Mobility:      difficulty with lifting moderately heavy object/kids using left UE   Balance:      Good   TREATMENT:    (In addition to Assessment/Re-Assessment sessions the following treatments were rendered)  THERAPEUTIC EXERCISE:( 8 minutes):  Exercises per grid below to improve mobility and strength.  Required moderate visual, verbal and manual cues to promote proper body alignment, promote proper body posture, promote proper body mechanics and promote proper body breathing techniques.  Progressed resistance, range and repetitions as indicated.   04/28/15 05/04/15 05/10/15 05/12/15 05/17/15 05/19/15 05/26/15 06/07/15   Activity/Exercise           UBE   4/4 min level 5  4/4 level 5  4/4 level 5 4/4 level 5  4/4 level 5 4/4 level 6 4/4 level 6 4/4 level 6   Wrist extension/flex 4# x 20                     Wrist supination/pronation  4# x 20    5# x 10        Rows     17# cable 2 x 10   17# cable 2 x 15   17# cable 2 x 15     Shoulder extension       10# cables x 15        Shoulder ER/IR             Wrist extensor stretching            Shoulder flexion   3# 2 x 10  supinated 4# x 10  5# x 10      shoulder abd    3# 2 x 10         Shoulder scaption    3# 2 x 10         Elbow flexion  Nuetral, pronated, supinated 2 x 10 each, 4#  Nuetral, pronated, supinated 2 x 10 each, 4#  Nuetral, pronated, supinated 2 x 10  each, 5# focusing on eccentric control  Nuetral, pronated, supinated 2 x 10 each, 5# focusing on eccentric control Nuetral, pronated, supinated 2 x 10 each, 5# focusing on eccentric control  Cables 7# 2 x 15     tricep pull down        7# 2 x 10 cables     AROM and strength testing            Chest press    13# 2 x 10         Overhead press     2 x 15        rebounder        Overhand throw green ball 2 x 20     Ball toss        Yellow  ball 2 x 20                  Manual Therapy (   30 min   ):   ?? MFR and trigger point to left wrist extensor muscles with use of edge tool   ??     Therapeutic Modalities:   ?? x 10 min cold back to left elbow and forearm                                                                                               HEP: As above; handouts given to patient for all exercises.  ______________________________________________________________________________________________________    Treatment Assessment:  Mild soreness of lateral forearm after manual.       Next treatment appointment scheduled:  Future Appointments  Date Time Provider [place]   06/09/2015 7:00 PM Buren Kos, PT [facility]   06/14/2015 7:45 AM Buren Kos, PT [facility]   06/16/2015 7:45 AM Buren Kos, PT [facility]     Progression/Medical Necessity:   ?? Patient is expected to demonstrate progress in strength and range of motion to decrease assistance required with recreational and home activities .  Compliance with Program/Exercises: Will assess as treatment progresses.   Reason for Continuation of Services/Other Comments:  ?? Patient continues to require modification of therapeutic interventions to increase complexity of exercises.  Recommendations/Intent for next treatment session: "Treatment next visit will focus on advancements to more challenging activities". AROM and PROM of left elbow, forearm and shoulder, scar mobilization, ice    Total Treatment Duration:   PT Patient Time In/Time Out  Time In: 0745  Time Out: [place], PT

## 2015-06-09 ENCOUNTER — Encounter: Payer: BLUE CROSS/BLUE SHIELD | Primary: Nurse Practitioner

## 2015-06-14 NOTE — Progress Notes (Signed)
Ronald Fields.  DOB: Jan 30, 1976 [facility]  [address], [place] [address]  [phone]   [phone]        OUTPATIENT DAILY NOTE    NAME/AGE/GENDER: Ronald Fields. is a 40 y.o. male.     DATE: 06/14/2015    Patient cancelled for appointment today due to work conflict.  Will plan to follow up on next scheduled visit.    Wilber Oliphant, PT

## 2015-06-15 ENCOUNTER — Inpatient Hospital Stay: Payer: BLUE CROSS/BLUE SHIELD | Primary: Nurse Practitioner

## 2015-06-16 DIAGNOSIS — M25522 Pain in left elbow: Secondary | ICD-10-CM

## 2015-06-16 NOTE — Progress Notes (Signed)
Ronald LongestMichael Beeghly Jr.  DOB: 03-09-1976 [facility]  [address], [address], [place] [id number]  Phone:[phone]   Fax:[phone]        OUTPATIENT DAILY NOTE    NAME/AGE/GENDER: Ronald LongestMichael Crumby Jr. is a 40 y.o. male.     DATE: 06/16/2015    Patient cancelled for appointment today due to work conflict.  Will plan to follow up on next scheduled visit.    Wilber OliphantKathryn A Jacari Iannello, PT

## 2015-06-17 ENCOUNTER — Inpatient Hospital Stay: Payer: BLUE CROSS/BLUE SHIELD | Primary: Nurse Practitioner

## 2015-06-22 NOTE — Progress Notes (Signed)
Ronald LongestMichael Trigueros Jr.  DOB: 01-26-1976 [facility]  [address], [address], [place] [id number]  [phone]   Fax:[phone]        OUTPATIENT DAILY NOTE    NAME/AGE/GENDER: Ronald LongestMichael Mago Jr. is a 40 y.o. male.     DATE: 06/22/2015    Patient no showed for appointment today.  Will plan to follow up on next scheduled visit.    Ronald Fields, PT

## 2015-06-23 ENCOUNTER — Inpatient Hospital Stay: Payer: BLUE CROSS/BLUE SHIELD | Primary: Nurse Practitioner

## 2015-06-30 ENCOUNTER — Inpatient Hospital Stay: Admit: 2015-06-30 | Payer: BLUE CROSS/BLUE SHIELD | Primary: Nurse Practitioner

## 2015-06-30 NOTE — Progress Notes (Addendum)
Ronald Punt.   (DOB:[birth year]) [facility] at   [facility]  [address], [address], [place]  Phone:[phone]   [phone]         Outpatient PHYSICAL THERAPY: Daily Note and Recertification 0/17/4944      Fall Risk Score: 1 (? 5 = High Risk)    ICD-10: Treatment Diagnosis:  Stiffness in joint; left elbow (M25.522), stiffness of joint; left wrist (M25.532), pain in limb, unspecified, arm (M79.602)     REFERRING PHYSICIAN: Maryclare Bean, MD  MD Orders: evaluate and treat  Return Physician Appointment: no scheduled    MEDICAL/REFERRING DIAGNOSIS:(S46.212A) strain of muscle, fascia and tendon of other parts of biceps, left arm   DATE OF ONSET: surgery: 02/04/15    PRIOR LEVEL OF FUNCTION: independent, active  PRECAUTIONS/ALLERGIES: No Known Allergies    ASSESSMENT:  ????????This section established at most recent assessment??????????  Ronald Punt. has attended 17 scheduled PT visits with 2 no shows and 5 cancellations.   He has progressed well but continues to have pain interfering with work and home activities.  The plan had been to discharge last week however, pt was forced to cancel the last 2 weeks of sessions due to work conflicts. I discussed extending his POC 2 -3 more weeks and reiterated the importance of attendance.  Pt would really like the current pain of left elbow/bicep region to resolve prior to discharge from PT and is agreeable to 1 x per week for the next 2-3 weeks then reevaluate for discharge.      PROBLEM LIST (Impairments causing functional limitations):  1. Decreased Strength affecting function  2. Decreased ADL/Functional Activities  3. Decreased Flexibility/joint mobility  4. Increased Pain affecting function  5. Other decreased knowledge of HEP  GOALS: (Goals have been discussed and agreed upon with patient.)  SHORT-TERM FUNCTIONAL GOALS:    1. Pt will be independent with HEP.(met)    2. Pt will report left UE pain does not exceed 2/10 during functional activities. (ongoing-3/10 highest)  3. Pt will improve left elbow extension by 10 degrees or greater to improve functional mobility. (met)   DISCHARGE GOALS: Time Frame: 6-8 week  1. Pt will demonstrate 5/5 strength forearm and elbow strength all planes.(met)  2. Pt will report no limitations from left UE pain or stiffness during recreational and home activities. (ongoing)  3. Pt will demonstrate DASH score improvement by at least 10 points. (met)    REHABILITATION POTENTIAL FOR STATED GOALS: GoodPLAN OF CARE:  INTERVENTIONS PLANNED: (Benefits and precautions of physical therapy have been discussed with the patient.)  1. cold  2. heat  3. home exercise program (HEP)  4. manual therapy  5. neuromuscular re-education/strengthening  6. range of motion: active/assisted/passive  7. therapeutic activities  8. therapeutic exercise/strengthening  TREATMENT PLAN EFFECTIVE DATES: 06/24/15-07/25/15  FREQUENCY/DURATION: Follow patient 1 time a week for 3 more weeks to address above goals.  Regarding Ronald Menna Jr.'s therapy, I certify that the treatment plan above will be carried out by a therapist or under their direction.  Thank you for this referral,  Buren Kos, PT     Referring Physician Signature: Maryclare Bean, MD          Date                       SUBJECTIVE:  06/30/2015   Limited mildly at work and home with lifting activities due to bicep pain. Currently  1-2/10 pain.          History of Present Injury/Illness (Reason for Referral):   Pt states that on 01/28/15 he felt left bicep pain while lifting weights.  He did not alter any activities at that time and played full game of rugby.  Over the next few days, he experienced significant arm pain and elbow restriction.  Went to urgent care and was then referred to orthopedist.  Dx with distal bicep rupture. Bicipital repair performed on  02/04/15. Pt reports left elbow was immobilized in a splint for 5 weeks. No other rehab prior to today.      Present Symptoms: intermittent moderate pain of left elbow, wrist, shoulder     Aggravating Factors: lifting weight, lifting his children, dressing    Relieving Factors: ibuprofen   Pain level: 1-2/10 presently, 5/10 worst, 1/10 best      Dominant Side: right  Past Medical History:   Past Medical History:   Diagnosis Date   ??? Left wrist pain      Current Medications:   Current Outpatient Prescriptions:   ???  NOREL AD 4-10-325 mg tab, TAKE 1 TABLET BY MOUTH TWICE A DAY, Disp: , Rfl: 2  ???  ondansetron (ZOFRAN ODT) 4 mg disintegrating tablet, TAKE 1 TABLET(S) UNDER THE TONGUE EVERY 4-6 HOURS AS NEEDED, Disp: , Rfl: 0  ???  ibuprofen 200 mg cap, Take  by mouth as needed., Disp: , Rfl:   ???  omega-3 fatty acids-vitamin e (FISH OIL) 1,000 mg cap, Take 1 Cap by mouth daily., Disp: , Rfl:   ???  clorazepate (TRANXENE) 3.75 mg tablet, Take 1 Tab by mouth two (2) times a day. Max Daily Amount: 7.5 mg. (Patient taking differently: Take 3.75 mg by mouth as needed for Sleep.), Disp: 36 Tab, Rfl: 2  ???  valACYclovir (VALTREX) 500 mg tablet, Take 1 Tab by mouth as needed. 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  Each section is scored on a 1-5 scale, 5 representing the greatest disability.   The scores of each section are added together for a total score of 55.    Observation/Orthostatic Postural Assessment:      no abnormalities  Palpation:      Surgical site healed   ROM:   Shoulder ROM  DATE  11.29.16 DATE  04/19/15   flexion R: WNL   L:  WNL  R:   L:    abd R: WNL  L:  WNL R:   L:    External rotation  R:  WFL   L:  WFL R:   L:    Internal rotation R: WFL   L: WFL R:   L:    Elbow flexion  R: 135  L:  135 R: 135  L: 135   Elbow extension  R: 0  L: -15 R: 0  L: 0  Strength:    DATE  03/15/15 DATE  04/19/15 DATE  06/30/15   Shoulder flexion R: 5  L: 4- R: 5  L: 5 R: 5  L: 5   Shoulder abd R: 5  L: 4 R: 5  L: 5 R: 5  L: 5   shoulder External rotation  R: 5  L: 4+ R: 5  L: 5 R: 5  L: 5   shoulder Internal rotation R: 5  L: 4+ R: 5  L: 5 R: 5  L: 5   Elbow flexion  R: 5  L:  4- R: 5  L: 4+  R: 5  L: 5   Elbow extension  R: 5  L: 4+ R: 5  L: 5 R: 5  L: 5   forearm Pronation R: 5  L: 4 R: 5  L: 5 R: 5  L: 5   forearm Supination  R: 5  L: 4-  R:5  L: 4 R: 5  L: 5   Wrist flexion  R: 5  L: 4+ R:5  L:5 R: 5  L: 5   Wrist extension  R:5   L: 4+ R:5  L: 5 R: 5  L: 5     Functional Mobility:      difficulty with lifting moderately heavy object/kids using left UE   Balance:      Good   TREATMENT:    (In addition to Assessment/Re-Assessment sessions the following treatments were rendered)  THERAPEUTIC EXERCISE:( 10 minutes):  Exercises per grid below to improve mobility and strength.  Required moderate visual, verbal and manual cues to promote proper body alignment, promote proper body posture, promote proper body mechanics and promote proper body breathing techniques.  Progressed resistance, range and repetitions as indicated.   05/04/15 05/10/15 05/12/15 05/17/15 05/19/15 05/26/15 06/07/15 06/30/15   Activity/Exercise           UBE   4/4 level 5  4/4 level 5 4/4 level 5  4/4 level 5 4/4 level 6 4/4 level 6 4/4 level 6 3/3 level 5    Wrist extension/flex        MMT x 3               Wrist supination/pronation    5# x 10      MMT x 3    Rows    17# cable 2 x 10   17# cable 2 x 15   17# cable 2 x 15      Shoulder extension      10# cables x 15      MMT x 3    Shoulder ER/IR          MMT x 3    Wrist extensor stretching            Shoulder flexion  3# 2 x 10  supinated 4# x 10  5# x 10    MMT x 3   shoulder abd   3# 2 x 10       MMT x 3   Shoulder scaption   3# 2 x 10          Elbow flexion  Nuetral, pronated, supinated 2 x 10 each, 4#  Nuetral, pronated, supinated 2 x 10 each, 5# focusing on eccentric control  Nuetral, pronated, supinated 2 x 10 each, 5# focusing on eccentric control Nuetral, pronated, supinated 2 x 10 each, 5# focusing on eccentric control  Cables 7# 2 x  15      tricep pull down       7# 2 x 10 cables      AROM and strength testing         X 5 min    Chest press   13# 2 x 10          Overhead press    2 x 15         rebounder       Overhand throw green ball 2 x 20      Ball toss       Yellow ball 2 x 20                   Manual Therapy (    30 min   ):   ?? MFR and trigger point to left wrist extensor muscles and bicep muscle belly    ??  elbow distraction   ?? prox radial and ulnar mobs all planes of movement grace IV    Therapeutic Modalities:   ?? x 10 min cold back to left elbow and forearm                                                                                               HEP: As above; handouts given to patient for all exercises.  ______________________________________________________________________________________________________    Treatment Assessment:  Mild soreness of left bicep and forearm after manual.       Next treatment appointment scheduled:  Future Appointments  Date Time Provider [place]   07/07/2015 1:00 PM Buren Kos, PT [facility]   07/14/2015 8:30 AM Buren Kos, PT [facility]     Progression/Medical Necessity:   ?? Patient is expected to demonstrate progress in strength and range of  motion to decrease assistance required with recreational and home activities .  Compliance with Program/Exercises: Will assess as treatment progresses.   Reason for Continuation of Services/Other Comments:  ?? Patient continues to require modification of therapeutic interventions to increase complexity of exercises.  Recommendations/Intent for next treatment session: "Treatment next visit will focus on advancements to more challenging activities".     Total Treatment Duration:  PT Patient Time In/Time Out  Time In: 0750  Time Out: [place], PT

## 2015-07-07 ENCOUNTER — Inpatient Hospital Stay: Admit: 2015-07-07 | Payer: BLUE CROSS/BLUE SHIELD | Primary: Nurse Practitioner

## 2015-07-07 NOTE — Progress Notes (Signed)
Ronald Fields.   (DOB:05-Dec-1975) [place] at   [facility]  [address] [place] [address], [place]  Phone:[phone]   [phone]         Outpatient PHYSICAL THERAPY: Daily Note 07/07/2015      Fall Risk Score: 1 (? 5 = High Risk)    ICD-10: Treatment Diagnosis:  Stiffness in joint; left elbow (M25.522), stiffness of joint; left wrist (M25.532), pain in limb, unspecified, arm (M79.602)     REFERRING PHYSICIAN: Maryclare Bean, MD  MD Orders: evaluate and treat  Return Physician Appointment: no scheduled    MEDICAL/REFERRING DIAGNOSIS:(S46.212A) strain of muscle, fascia and tendon of other parts of biceps, left arm   DATE OF ONSET: surgery: 02/04/15    PRIOR LEVEL OF FUNCTION: independent, active  PRECAUTIONS/ALLERGIES: No Known Allergies    ASSESSMENT:  ????????This section established at most recent assessment??????????  Ronald Fields. has attended 17 scheduled PT visits with 2 no shows and 5 cancellations.   He has progressed well but continues to have pain interfering with work and home activities.  The plan had been to discharge last week however, pt was forced to cancel the last 2 weeks of sessions due to work conflicts. I discussed extending his POC 2 -3 more weeks and reiterated the importance of attendance.  Pt would really like the current pain of left elbow/bicep region to resolve prior to discharge from PT and is agreeable to 1 x per week for the next 2-3 weeks then reevaluate for discharge.      PROBLEM LIST (Impairments causing functional limitations):  1. Decreased Strength affecting function  2. Decreased ADL/Functional Activities  3. Decreased Flexibility/joint mobility  4. Increased Pain affecting function  5. Other decreased knowledge of HEP  GOALS: (Goals have been discussed and agreed upon with patient.)  SHORT-TERM FUNCTIONAL GOALS:    1. Pt will be independent with HEP.(met)    2. Pt will report left UE pain does not exceed 2/10 during functional activities. (ongoing-3/10 highest)  3. Pt will improve left elbow extension by 10 degrees or greater to improve functional mobility. (met)   DISCHARGE GOALS: Time Frame: 6-8 week  1. Pt will demonstrate 5/5 strength forearm and elbow strength all planes.(met)  2. Pt will report no limitations from left UE pain or stiffness during recreational and home activities. (ongoing)  3. Pt will demonstrate DASH score improvement by at least 10 points. (met)    REHABILITATION POTENTIAL FOR STATED GOALS: GoodPLAN OF CARE:  INTERVENTIONS PLANNED: (Benefits and precautions of physical therapy have been discussed with the patient.)  1. cold  2. heat  3. home exercise program (HEP)  4. manual therapy  5. neuromuscular re-education/strengthening  6. range of motion: active/assisted/passive  7. therapeutic activities  8. therapeutic exercise/strengthening  TREATMENT PLAN EFFECTIVE DATES: 06/24/15-07/25/15  FREQUENCY/DURATION: Follow patient 1 time a week for 3 more weeks to address above goals.  Regarding Ronald Lucchese Jr.'s therapy, I certify that the treatment plan above will be carried out by a therapist or under their direction.  Thank you for this referral,  Buren Kos, PT     Referring Physician Signature: Maryclare Bean, MD          Date                       SUBJECTIVE:  07/07/2015   Mild to mod pain of bicep and elbow with lifting activities over the past week. He  is trying to use arm as normally as can.           History of Present Injury/Illness (Reason for Referral):   Pt states that on 01/28/15 he felt left bicep pain while lifting weights.  He did not alter any activities at that time and played full game of rugby.  Over the next few days, he experienced significant arm pain and elbow restriction.  Went to urgent care and was then referred to orthopedist.  Dx with distal bicep rupture. Bicipital repair performed on  02/04/15. Pt reports left elbow was immobilized in a splint for 5 weeks. No other rehab prior to today.      Present Symptoms: intermittent moderate pain of left elbow, wrist, shoulder     Aggravating Factors: lifting weight, lifting his children, dressing    Relieving Factors: ibuprofen   Pain level: 1-2/10 presently, 5/10 worst, 1/10 best      Dominant Side: right  Past Medical History:   Past Medical History:   Diagnosis Date   ??? Left wrist pain      Current Medications:   Current Outpatient Prescriptions:   ???  NOREL AD 4-10-325 mg tab, TAKE 1 TABLET BY MOUTH TWICE A DAY, Disp: , Rfl: 2  ???  ondansetron (ZOFRAN ODT) 4 mg disintegrating tablet, TAKE 1 TABLET(S) UNDER THE TONGUE EVERY 4-6 HOURS AS NEEDED, Disp: , Rfl: 0  ???  ibuprofen 200 mg cap, Take  by mouth as needed., Disp: , Rfl:   ???  omega-3 fatty acids-vitamin e (FISH OIL) 1,000 mg cap, Take 1 Cap by mouth daily., Disp: , Rfl:   ???  clorazepate (TRANXENE) 3.75 mg tablet, Take 1 Tab by mouth two (2) times a day. Max Daily Amount: 7.5 mg. (Patient taking differently: Take 3.75 mg by mouth as needed for Sleep.), Disp: 36 Tab, Rfl: 2  ???  valACYclovir (VALTREX) 500 mg tablet, Take 1 Tab by mouth as needed. 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  Each section is scored on a 1-5 scale, 5 representing the greatest disability.   The scores of each section are added together for a total score of 55.    Observation/Orthostatic Postural Assessment:      no abnormalities  Palpation:      Surgical site healed   ROM:   Shoulder ROM  DATE  11.29.16 DATE  04/19/15   flexion R: WNL   L:  WNL  R:   L:    abd R: WNL  L:  WNL R:   L:    External rotation  R:  WFL   L:  WFL R:   L:    Internal rotation R: WFL   L: WFL R:   L:    Elbow flexion  R: 135  L:  135 R: 135  L: 135   Elbow extension  R: 0  L: -  15 R: 0  L: 0     Strength:    DATE  03/15/15 DATE  04/19/15 DATE  06/30/15   Shoulder flexion R: 5  L: 4- R: 5  L: 5 R: 5  L: 5   Shoulder abd R: 5  L: 4 R: 5  L: 5 R: 5  L: 5   shoulder External rotation  R: 5  L: 4+ R: 5  L: 5 R: 5  L: 5   shoulder Internal rotation R: 5  L: 4+ R: 5  L: 5 R: 5  L: 5   Elbow flexion  R: 5  L:  4- R: 5  L: 4+  R: 5  L: 5   Elbow extension  R: 5  L: 4+ R: 5  L: 5 R: 5  L: 5   forearm Pronation R: 5  L: 4 R: 5  L: 5 R: 5  L: 5   forearm Supination  R: 5  L: 4-  R:5  L: 4 R: 5  L: 5   Wrist flexion  R: 5  L: 4+ R:5  L:5 R: 5  L: 5   Wrist extension  R:5   L: 4+ R:5  L: 5 R: 5  L: 5     Functional Mobility:      difficulty with lifting moderately heavy object/kids using left UE   Balance:      Good   TREATMENT:    (In addition to Assessment/Re-Assessment sessions the following treatments were rendered)  THERAPEUTIC EXERCISE:( 0 minutes):  Exercises per grid below to improve mobility and strength.  Required moderate visual, verbal and manual cues to promote proper body alignment, promote proper body posture, promote proper body mechanics and promote proper body breathing techniques.  Progressed resistance, range and repetitions as indicated.   05/10/15 05/12/15 05/17/15 05/19/15 05/26/15 06/07/15 06/30/15 07/07/15   Activity/Exercise           UBE   4/4 level 5 4/4 level 5  4/4 level 5 4/4 level 6 4/4 level 6 4/4 level 6 3/3 level 5  3/3 level 5   Wrist extension/flex       MMT x 3                Wrist supination/pronation   5# x 10      MMT x 3     Rows   17# cable 2 x 10   17# cable 2 x 15   17# cable 2 x 15       Shoulder extension     10# cables x 15      MMT x 3     Shoulder ER/IR         MMT x 3     Wrist extensor stretching            Shoulder flexion 3# 2 x 10  supinated 4# x 10  5# x 10    MMT x 3    shoulder abd  3# 2 x 10       MMT x 3    Shoulder scaption  3# 2 x 10           Elbow flexion  Nuetral, pronated, supinated 2 x 10 each, 5# focusing on eccentric control  Nuetral, pronated, supinated 2 x 10 each, 5# focusing on eccentric control Nuetral, pronated, supinated 2 x 10 each, 5# focusing on eccentric control  Cables 7# 2 x  15       tricep pull down      7# 2 x 10 cables       AROM and strength testing        X 5 min     Chest press  13# 2 x 10           Overhead press   2 x 15          rebounder      Overhand throw green ball 2 x 20       Ball toss      Yellow ball 2 x 20                    Manual Therapy (    40 min   ):   ?? MFR and trigger point to left wrist extensor muscles and bicep muscle belly and distal tendon    ??  elbow distraction   ?? prox radial and ulnar mobs all planes of movement grace IV    Therapeutic Modalities:   ?? x 10 min cold back to left elbow and forearm                                                                                               HEP: As above; handouts given to patient for all exercises.  ______________________________________________________________________________________________________    Treatment Assessment:  Mild soreness of left bicep and forearm after manual treatment.       Next treatment appointment scheduled:  Future Appointments  Date Time Provider [place]   07/14/2015 8:30 AM Buren Kos, PT [facility]     Progression/Medical Necessity:   ?? Patient is expected to demonstrate progress in strength and range of motion to decrease assistance required with recreational and home activities .   Compliance with Program/Exercises: Will assess as treatment progresses.   Reason for Continuation of Services/Other Comments:  ?? Patient continues to require modification of therapeutic interventions to increase complexity of exercises.  Recommendations/Intent for next treatment session: "Treatment next visit will focus on advancements to more challenging activities".     Total Treatment Duration:  PT Patient Time In/Time Out  Time In: 1300  Time Out: [place]

## 2015-07-14 ENCOUNTER — Inpatient Hospital Stay: Admit: 2015-07-14 | Payer: BLUE CROSS/BLUE SHIELD | Primary: Nurse Practitioner

## 2015-07-14 NOTE — Progress Notes (Signed)
Ronald Fields.   (DOB:1976/01/12) [place] at   [facility]  [address], [place]  Phone:[phone]   [phone]         Outpatient PHYSICAL THERAPY: Daily Note 07/14/2015      Fall Risk Score: 1 (? 5 = High Risk)    ICD-10: Treatment Diagnosis:  Stiffness in joint; left elbow (M25.522), stiffness of joint; left wrist (M25.532), pain in limb, unspecified, arm (M79.602)     REFERRING PHYSICIAN: Maryclare Bean, MD  MD Orders: evaluate and treat  Return Physician Appointment: no scheduled    MEDICAL/REFERRING DIAGNOSIS:(S46.212A) strain of muscle, fascia and tendon of other parts of biceps, left arm   DATE OF ONSET: surgery: 02/04/15    PRIOR LEVEL OF FUNCTION: independent, active  PRECAUTIONS/ALLERGIES: No Known Allergies    ASSESSMENT:  ????????This section established at most recent assessment??????????  Ronald Fields. has attended 17 scheduled PT visits with 2 no shows and 5 cancellations.   He has progressed well but continues to have pain interfering with work and home activities.  The plan had been to discharge last week however, pt was forced to cancel the last 2 weeks of sessions due to work conflicts. I discussed extending his POC 2 -3 more weeks and reiterated the importance of attendance.  Pt would really like the current pain of left elbow/bicep region to resolve prior to discharge from PT and is agreeable to 1 x per week for the next 2-3 weeks then reevaluate for discharge.      PROBLEM LIST (Impairments causing functional limitations):  1. Decreased Strength affecting function  2. Decreased ADL/Functional Activities  3. Decreased Flexibility/joint mobility  4. Increased Pain affecting function  5. Other decreased knowledge of HEP  GOALS: (Goals have been discussed and agreed upon with patient.)  SHORT-TERM FUNCTIONAL GOALS:    1. Pt will be independent with HEP.(met)    2. Pt will report left UE pain does not exceed 2/10 during functional activities. (ongoing-3/10 highest)  3. Pt will improve left elbow extension by 10 degrees or greater to improve functional mobility. (met)   DISCHARGE GOALS: Time Frame: 6-8 week  1. Pt will demonstrate 5/5 strength forearm and elbow strength all planes.(met)  2. Pt will report no limitations from left UE pain or stiffness during recreational and home activities. (ongoing)  3. Pt will demonstrate DASH score improvement by at least 10 points. (met)    REHABILITATION POTENTIAL FOR STATED GOALS: GoodPLAN OF CARE:  INTERVENTIONS PLANNED: (Benefits and precautions of physical therapy have been discussed with the patient.)  1. cold  2. heat  3. home exercise program (HEP)  4. manual therapy  5. neuromuscular re-education/strengthening  6. range of motion: active/assisted/passive  7. therapeutic activities  8. therapeutic exercise/strengthening  TREATMENT PLAN EFFECTIVE DATES: 06/24/15-07/25/15  FREQUENCY/DURATION: Follow patient 1 time a week for 3 more weeks to address above goals.  Regarding Raiford Fetterman Jr.'s therapy, I certify that the treatment plan above will be carried out by a therapist or under their direction.  Thank you for this referral,  Buren Kos, PT     Referring Physician Signature: Maryclare Bean, MD          Date                       SUBJECTIVE:  07/14/2015   Mild to mod pain of bicep and elbow during heavy lifting but is able to perform  all his gym exercises without limitations.           History of Present Injury/Illness (Reason for Referral):   Pt states that on 01/28/15 he felt left bicep pain while lifting weights.  He did not alter any activities at that time and played full game of rugby.  Over the next few days, he experienced significant arm pain and elbow restriction.  Went to urgent care and was then referred to orthopedist.  Dx with distal bicep rupture. Bicipital repair performed on  02/04/15. Pt reports left elbow was immobilized in a splint for 5 weeks. No other rehab prior to today.      Present Symptoms: intermittent moderate pain of left elbow, wrist, shoulder     Aggravating Factors: lifting weight, lifting his children, dressing    Relieving Factors: ibuprofen   Pain level: 1-2/10 presently, 5/10 worst, 1/10 best      Dominant Side: right  Past Medical History:   Past Medical History:   Diagnosis Date   ??? Left wrist pain      Current Medications:   Current Outpatient Prescriptions:   ???  NOREL AD 4-10-325 mg tab, TAKE 1 TABLET BY MOUTH TWICE A DAY, Disp: , Rfl: 2  ???  ondansetron (ZOFRAN ODT) 4 mg disintegrating tablet, TAKE 1 TABLET(S) UNDER THE TONGUE EVERY 4-6 HOURS AS NEEDED, Disp: , Rfl: 0  ???  ibuprofen 200 mg cap, Take  by mouth as needed., Disp: , Rfl:   ???  omega-3 fatty acids-vitamin e (FISH OIL) 1,000 mg cap, Take 1 Cap by mouth daily., Disp: , Rfl:   ???  clorazepate (TRANXENE) 3.75 mg tablet, Take 1 Tab by mouth two (2) times a day. Max Daily Amount: 7.5 mg. (Patient taking differently: Take 3.75 mg by mouth as needed for Sleep.), Disp: 36 Tab, Rfl: 2  ???  valACYclovir (VALTREX) 500 mg tablet, Take 1 Tab by mouth as needed. 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  Each section is scored on a 1-5 scale, 5 representing the greatest disability.   The scores of each section are added together for a total score of 55.    Observation/Orthostatic Postural Assessment:      no abnormalities  Palpation:      Surgical site healed   ROM:   Shoulder ROM  DATE  11.29.16 DATE  04/19/15   flexion R: WNL   L:  WNL  R:   L:    abd R: WNL  L:  WNL R:   L:    External rotation  R:  WFL   L:  WFL R:   L:    Internal rotation R: WFL   L: WFL R:   L:    Elbow flexion  R: 135  L:  135 R: 135  L: 135   Elbow extension  R: 0  L: -15 R: 0  L: 0     Strength:    DATE  03/15/15 DATE  04/19/15 DATE  06/30/15   Shoulder flexion R: 5  L: 4- R: 5  L: 5 R: 5  L: 5   Shoulder abd R: 5  L: 4 R: 5  L: 5 R: 5  L: 5   shoulder External rotation  R: 5  L: 4+ R: 5  L: 5 R: 5  L: 5   shoulder Internal rotation R: 5  L: 4+ R: 5  L: 5 R: 5  L: 5   Elbow flexion  R: 5  L:  4- R: 5  L: 4+  R: 5  L: 5   Elbow extension  R: 5  L: 4+ R: 5  L: 5 R: 5  L: 5   forearm Pronation R: 5  L: 4 R: 5  L: 5 R: 5  L: 5   forearm Supination  R: 5  L: 4-  R:5  L: 4 R: 5  L: 5   Wrist flexion  R: 5  L: 4+ R:5  L:5 R: 5  L: 5   Wrist extension  R:5   L: 4+ R:5  L: 5 R: 5  L: 5     Functional Mobility:      difficulty with lifting moderately heavy object/kids using left UE   Balance:      Good   TREATMENT:    (In addition to Assessment/Re-Assessment sessions the following treatments were rendered)  THERAPEUTIC EXERCISE:( 8 minutes):  Exercises per grid below to improve mobility and strength.  Required moderate visual, verbal and manual cues to promote proper body alignment, promote proper body posture, promote proper body mechanics and promote proper body breathing techniques.  Progressed resistance, range and repetitions as indicated.   05/10/15 05/12/15 05/17/15 05/19/15 05/26/15 06/07/15 06/30/15 07/07/15   Activity/Exercise           UBE   4/4 level 5 4/4 level 5  4/4 level 5 4/4 level 6 4/4 level 6 4/4 level 6 3/3 level 5  8 min level 5   Wrist extension/flex       MMT x 3                Wrist supination/pronation   5# x 10      MMT x 3     Rows   17# cable 2 x 10   17# cable 2 x 15   17# cable 2 x 15       Shoulder extension     10# cables x 15      MMT x 3     Shoulder ER/IR         MMT x 3     Wrist extensor stretching            Shoulder flexion 3# 2 x 10  supinated 4# x 10  5# x 10    MMT x 3    shoulder abd  3# 2 x 10       MMT x 3    Shoulder scaption  3# 2 x 10           Elbow flexion  Nuetral, pronated, supinated 2 x 10 each, 5# focusing on eccentric control  Nuetral, pronated, supinated 2 x 10 each, 5# focusing on eccentric control Nuetral, pronated, supinated 2 x 10 each, 5# focusing on eccentric control  Cables 7# 2 x 15  tricep pull down      7# 2 x 10 cables       AROM and strength testing        X 5 min     Chest press  13# 2 x 10           Overhead press   2 x 15          rebounder      Overhand throw green ball 2 x 20       Ball toss      Yellow ball 2 x 20                    Manual Therapy (    25 min   ):   ?? MFR and trigger point to left wrist extensor muscles and bicep muscle belly and distal tendon    ??  elbow distraction   ?? prox radial and ulnar mobs all planes of movement grace IV    Therapeutic Modalities:   ?? x 10 min cold back to left elbow and forearm                                                                                               HEP: As above; handouts given to patient for all exercises.  ______________________________________________________________________________________________________    Treatment Assessment:  Decreased trigger points after manual treatment         Next treatment appointment scheduled:  Future Appointments  Date Time Provider [place]   07/20/2015 8:30 AM Buren Kos, PT [facility]     Progression/Medical Necessity:   ?? Patient is expected to demonstrate progress in strength and range of motion to decrease assistance required with recreational and home activities .   Compliance with Program/Exercises: Will assess as treatment progresses.   Reason for Continuation of Services/Other Comments:  ?? Patient continues to require modification of therapeutic interventions to increase complexity of exercises.  Recommendations/Intent for next treatment session: "Treatment next visit will focus on advancements to more challenging activities". Discharge next visit      Total Treatment Duration:  PT Patient Time In/Time Out  Time In: 0845  Time Out: [place], PT

## 2015-07-18 ENCOUNTER — Ambulatory Visit
Admit: 2015-07-18 | Discharge: 2015-07-18 | Payer: BLUE CROSS/BLUE SHIELD | Attending: Family Medicine | Primary: Nurse Practitioner

## 2015-07-18 DIAGNOSIS — J019 Acute sinusitis, unspecified: Secondary | ICD-10-CM

## 2015-07-18 MED ORDER — AZITHROMYCIN 250 MG TAB
250 mg | ORAL_TABLET | ORAL | 1 refills | Status: DC
Start: 2015-07-18 — End: 2016-01-24

## 2015-07-18 MED ORDER — CLORAZEPATE DIPOTASSIUM 3.75 MG TAB
3.75 mg | ORAL_TABLET | Freq: Two times a day (BID) | ORAL | 2 refills | Status: DC
Start: 2015-07-18 — End: 2016-01-24

## 2015-07-18 MED ORDER — NOREL AD 4 MG-10 MG-325 MG TABLET
4-10-325 mg | ORAL_TABLET | Freq: Two times a day (BID) | ORAL | 3 refills | Status: DC
Start: 2015-07-18 — End: 2016-09-13

## 2015-07-18 NOTE — Progress Notes (Signed)
Ronald Fields.  1975-08-26    HPI:  [name] is a 40 y.o. male here for sinus pressure and pain for last week. No fevers, but has this about once a year. He has some pain in teeth at times. Also has infrequent need for tranxene at night to help sleep. Wants refill.     Review of Systems: denies fever, chills, chest pain and shortness of breath.    Past Medical History:   Diagnosis Date   ??? Left wrist pain        Social History     Social History   ??? Marital status: MARRIED     Spouse name: N/A   ??? Number of children: N/A   ??? Years of education: N/A     Occupational History   ??? Not on file.     Social History Main Topics   ??? Smoking status: Never Smoker   ??? Smokeless tobacco: Never Used   ??? Alcohol use Yes      Comment: doesn't drink on a weekly basis   ??? Drug use: No   ??? Sexual activity: Not on file     Other Topics Concern   ??? Not on file     Social History Narrative       Family History   Problem Relation Age of Onset   ??? Cancer Father      prostate       Past Medical History:   Diagnosis Date   ??? Left wrist pain        Current Outpatient Prescriptions   Medication Sig Dispense Refill   ??? NOREL AD 4-10-325 mg tab Take 1 Tab by mouth two (2) times a day. 30 Tab 3   ??? clorazepate (TRANXENE) 3.75 mg tablet Take 1 Tab by mouth two (2) times a day. Max Daily Amount: 7.5 mg. 36 Tab 2   ??? azithromycin (ZITHROMAX) 250 mg tablet Take two tablets today then one tablet daily 6 Tab 1   ??? ibuprofen 200 mg cap Take  by mouth as needed.     ??? omega-3 fatty acids-vitamin e (FISH OIL) 1,000 mg cap Take 1 Cap by mouth daily.     ??? valACYclovir (VALTREX) 500 mg tablet Take 1 Tab by mouth as needed. Indications: SUPPRESSION OF RECURRENT HERPES SIMPLEX INFECTION 30 Tab 3       No Known Allergies            Physical Exam:    Vitals: as on chart -   Visit Vitals   ??? BP 118/82   ??? Pulse 67   ??? Temp 97 ??F (36.1 ??C) (Tympanic)   ??? Resp 16   ??? Ht 6' 2" (1.88 m)   ??? Wt 236 lb (107 kg)   ??? SpO2 98%   ??? BMI 30.3 kg/m2      General: Well nourished, well developed, no apparent distress.  Nose boggy throat clear, ears clear bilaterally.   Lungs: Clear to auscultation bilaterally.  CV: Regular rate and rhythm with normal S1 and S2. No murmur appreciated.  Abdomen: Normal bowel sounds. Soft, non tender, no hepatomegaly. No guarding or rebound.  Extremities: No deformity. No clubbing, cyanosis or edema.    Labs:   Results for orders placed or performed in visit on 03/15/15   CBC WITH AUTOMATED DIFF   Result Value Ref Range    WBC 7.0 3.4 - 10.8 x10E3/uL    RBC 4.63 4.14 - 5.80 x10E6/uL  HGB 13.9 12.6 - 17.7 g/dL    HCT 40.8 37.5 - 51.0 %    MCV 88 79 - 97 fL    MCH 30.0 26.6 - 33.0 pg    MCHC 34.1 31.5 - 35.7 g/dL    RDW 14.0 12.3 - 15.4 %    PLATELET 162 150 - 379 x10E3/uL    NEUTROPHILS 56 %    Lymphocytes 34 %    MONOCYTES 7 %    EOSINOPHILS 3 %    BASOPHILS 0 %    ABS. NEUTROPHILS 3.9 1.4 - 7.0 x10E3/uL    Abs Lymphocytes 2.3 0.7 - 3.1 x10E3/uL    ABS. MONOCYTES 0.5 0.1 - 0.9 x10E3/uL    ABS. EOSINOPHILS 0.2 0.0 - 0.4 x10E3/uL    ABS. BASOPHILS 0.0 0.0 - 0.2 x10E3/uL    IMMATURE GRANULOCYTES 0 %    ABS. IMM. GRANS. 0.0 0.0 - 0.1 R00T6/AU   METABOLIC PANEL, COMPREHENSIVE   Result Value Ref Range    Glucose 82 65 - 99 mg/dL    BUN 22 (H) 6 - 20 mg/dL    Creatinine 0.94 0.76 - 1.27 mg/dL    GFR est non-AA 102 >59 mL/min/1.73    GFR est AA 118 >59 mL/min/1.73    BUN/Creatinine ratio 23 (H) 8 - 19    Sodium 143 136 - 144 mmol/L    Potassium 4.7 3.5 - 5.2 mmol/L    Chloride 104 97 - 106 mmol/L    CO2 25 18 - 29 mmol/L    Calcium 8.8 8.7 - 10.2 mg/dL    Protein, total 6.9 6.0 - 8.5 g/dL    Albumin 4.1 3.5 - 5.5 g/dL    GLOBULIN, TOTAL 2.8 1.5 - 4.5 g/dL    A-G Ratio 1.5 1.1 - 2.5    Bilirubin, total 0.3 0.0 - 1.2 mg/dL    Alk. phosphatase 55 39 - 117 IU/L    AST (SGOT) 23 0 - 40 IU/L    ALT (SGPT) 15 0 - 44 IU/L   TSH 3RD GENERATION   Result Value Ref Range    TSH 3.570 0.450 - 4.500 uIU/mL   PSA DIAGNOSTIC (PROSTATIC SPECIFIC AG)    Result Value Ref Range    Prostate Specific Ag 0.9 0.0 - 4.0 ng/mL   LIPID PANEL WITH LDL/HDL RATIO   Result Value Ref Range    Cholesterol, total 193 100 - 199 mg/dL    Triglyceride 161 (H) 0 - 149 mg/dL    HDL Cholesterol 41 >39 mg/dL    VLDL, calculated 32 5 - 40 mg/dL    LDL, calculated 120 (H) 0 - 99 mg/dL    LDL/HDL Ratio 2.9 0.0 - 3.6 ratio units   HEMOGLOBIN A1C WITH EAG   Result Value Ref Range    Hemoglobin A1c 5.8 (H) 4.8 - 5.6 %    Estimated average glucose 120 mg/dL       Assessment/Plan:     1. Situational anxiety  Does well with sparing use, discussed risks and limiting use, follow up prn  - clorazepate (TRANXENE) 3.75 mg tablet; Take 1 Tab by mouth two (2) times a day. Max Daily Amount: 7.5 mg.  Dispense: 36 Tab; Refill: 2    2. Acute sinusitis, recurrence not specified, unspecified location  Viral vs. Bacterial, try supportive care and if not improving or worsening in next week may need to take abx.       Orders Placed This Encounter   ??? NOREL AD  4-10-325 mg tab     Sig: Take 1 Tab by mouth two (2) times a day.     Dispense:  30 Tab     Refill:  3   ??? clorazepate (TRANXENE) 3.75 mg tablet     Sig: Take 1 Tab by mouth two (2) times a day. Max Daily Amount: 7.5 mg.     Dispense:  36 Tab     Refill:  2   ??? azithromycin (ZITHROMAX) 250 mg tablet     Sig: Take two tablets today then one tablet daily     Dispense:  6 Tab     Refill:  1       Ronald Sabal, MD      Dictated using voice recognition software. Proofread, but unrecognized voice recognition errors may exist.

## 2015-07-20 ENCOUNTER — Inpatient Hospital Stay: Admit: 2015-07-20 | Payer: BLUE CROSS/BLUE SHIELD | Primary: Nurse Practitioner

## 2015-07-20 DIAGNOSIS — M25522 Pain in left elbow: Secondary | ICD-10-CM

## 2015-07-20 NOTE — Progress Notes (Signed)
Ronald Punt.   (DOB:1976/01/03) [place] [facility]   [facility]  [address], [place]  [phone]   [phone]         Outpatient PHYSICAL THERAPY: Daily Note and Discharge 07/20/2015      Fall Risk Score: 1 (? 5 = High Risk)    ICD-10: Treatment Diagnosis:  Stiffness in joint; left elbow (M25.522), stiffness of joint; left wrist (M25.532), pain in limb, unspecified, arm (M79.602)     REFERRING PHYSICIAN: Maryclare Bean, MD  MD Orders: evaluate and treat  Return Physician Appointment: no scheduled    MEDICAL/REFERRING DIAGNOSIS:(S46.212A) strain of muscle, fascia and tendon of other parts of biceps, left arm   DATE OF ONSET: surgery: 02/04/15    PRIOR LEVEL OF FUNCTION: independent, active  PRECAUTIONS/ALLERGIES: No Known Allergies    ASSESSMENT:  ????????This section established at most recent assessment??????????  Ronald Punt. has attended 20 scheduled PT visits with 2 no shows and 5 cancellations.   He has progressed well overall and is able to complete full gym routine with very mild modifications. Pt independent with a progressive HEP as well as trigger point release techniques for bicep region to promote healing. He has met most therapy goals and is ready for discharge.    GOALS: (Goals have been discussed and agreed upon with patient.)  SHORT-TERM FUNCTIONAL GOALS:    1. Pt will be independent with HEP.(met)   2. Pt will report left UE pain does not exceed 2/10 during functional activities. (met)  3. Pt will improve left elbow extension by 10 degrees or greater to improve functional mobility. (met)   DISCHARGE GOALS: Time Frame: 6-8 week  1. Pt will demonstrate 5/5 strength forearm and elbow strength all planes.(met)  2. Pt will report no limitations from left UE pain or stiffness during recreational and home activities. (some mild limitations with high level recreational activities)   3. Pt will demonstrate DASH score improvement by at least 10 points. (met)      Thank you for this referral,  Buren Kos, PT     SUBJECTIVE:  07/20/2015   Feeling good. No new c/o's.           History of Present Injury/Illness (Reason for Referral):   Pt states that on 01/28/15 he felt left bicep pain while lifting weights.  He did not alter any activities at that time and played full game of rugby.  Over the next few days, he experienced significant arm pain and elbow restriction.  Went to urgent care and was then referred to orthopedist.  Dx with distal bicep rupture. Bicipital repair performed on 02/04/15. Pt reports left elbow was immobilized in a splint for 5 weeks. No other rehab prior to today.      Present Symptoms: intermittent moderate pain of left elbow, wrist, shoulder     Aggravating Factors: lifting weight, lifting his children, dressing    Relieving Factors: ibuprofen   Pain level: 1-2/10 presently, 5/10 worst, 1/10 best      Dominant Side: right  Past Medical History:   Past Medical History:   Diagnosis Date   ??? Left wrist pain      Current Medications:   Current Outpatient Prescriptions:   ???  NOREL AD 4-10-325 mg tab, Take 1 Tab by mouth two (2) times a day., Disp: 30 Tab, Rfl: 3  ???  clorazepate (TRANXENE) 3.75 mg tablet, Take 1 Tab by mouth two (2) times a day.  Max Daily Amount: 7.5 mg., Disp: 36 Tab, Rfl: 2  ???  azithromycin (ZITHROMAX) 250 mg tablet, Take two tablets today then one tablet daily, Disp: 6 Tab, Rfl: 1  ???  ibuprofen 200 mg cap, Take  by mouth as needed., Disp: , Rfl:   ???  omega-3 fatty acids-vitamin e (FISH OIL) 1,000 mg cap, Take 1 Cap by mouth daily., Disp: , Rfl:   ???  valACYclovir (VALTREX) 500 mg tablet, Take 1 Tab by mouth as needed. 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  Each section is scored on a 1-5 scale, 5 representing the greatest disability.  The scores of each section are added together for a total score of 55.    Observation/Orthostatic Postural Assessment:      no abnormalities  Palpation:      Surgical site healed   ROM:   Shoulder ROM  DATE  11.29.16 DATE  4/5   flexion R: WNL   L:  WNL  R:   L:    abd R: WNL  L:  WNL R:   L:    External rotation  R:  WFL   L:  WFL R:   L:    Internal rotation R: WFL   L: WFL R:   L:    Elbow flexion  R: 135  L:  135 R: 135  L: 135   Elbow extension  R: 0  L: -15 R: 0  L: 0     Strength:    DATE  03/15/15 DATE  04/19/15 DATE  06/30/15   Shoulder flexion R: 5  L: 4- R: 5  L: 5 R: 5  L: 5   Shoulder abd R: 5  L: 4 R: 5  L: 5 R: 5  L: 5   shoulder External rotation  R: 5  L: 4+ R: 5  L: 5 R: 5  L: 5   shoulder Internal rotation R: 5  L: 4+ R: 5  L: 5 R: 5  L: 5   Elbow flexion  R: 5  L:  4- R: 5  L: 4+  R: 5  L: 5   Elbow extension  R: 5  L: 4+ R: 5  L: 5 R: 5  L: 5   forearm Pronation R: 5  L: 4 R: 5  L: 5 R: 5  L: 5   forearm Supination  R: 5  L: 4-  R:5  L: 4 R: 5  L: 5   Wrist flexion  R: 5  L: 4+ R:5  L:5 R: 5  L: 5   Wrist extension  R:5   L: 4+ R:5  L: 5 R: 5  L: 5     Functional Mobility:      mild difficulty with lifting moderately heavy object/kids using left UE   Balance:  Good   TREATMENT:    (In addition to Assessment/Re-Assessment sessions the following treatments were rendered)  THERAPEUTIC EXERCISE:( 8 minutes):  Exercises per grid below to improve mobility and strength.  Required moderate visual, verbal and manual cues  to promote proper body alignment, promote proper body posture, promote proper body mechanics and promote proper body breathing techniques.  Progressed resistance, range and repetitions as indicated.   05/12/15 05/17/15 05/19/15 05/26/15 06/07/15 06/30/15 07/07/15 07/20/15   Activity/Exercise           UBE   4/4 level 5  4/4 level 5 4/4 level 6 4/4 level 6 4/4 level 6 3/3 level 5  8 min level 5 8 min level 5    Wrist extension/flex      MMT x 3                Wrist supination/pronation  5# x 10      MMT x 3      Rows    17# cable 2 x 15   17# cable 2 x 15        Shoulder extension    10# cables x 15      MMT x 3      Shoulder ER/IR        MMT x 3      Wrist extensor stretching            Shoulder flexion supinated 4# x 10  5# x 10    MMT x 3     shoulder abd       MMT x 3     Shoulder scaption            Elbow flexion  Nuetral, pronated, supinated 2 x 10 each, 5# focusing on eccentric control Nuetral, pronated, supinated 2 x 10 each, 5# focusing on eccentric control  Cables 7# 2 x 15        tricep pull down     7# 2 x 10 cables        AROM and strength testing       X 5 min      Chest press            Overhead press  2 x 15           rebounder     Overhand throw green ball 2 x 20        Ball toss     Yellow ball 2 x 20                     Manual Therapy (    30 min   ):   ?? MFR and trigger point to left wrist extensor muscles and bicep muscle belly and distal tendon    ??  elbow distraction   ?? prox radial and ulnar mobs all planes of movement grace IV    Therapeutic Modalities:   ?? x 10 min cold back to left elbow and forearm  HEP: As above; handouts given to patient for all exercises.  ______________________________________________________________________________________________________    Treatment Assessment:  Decreased trigger points after manual treatment. No problems.              Total Treatment Duration:   PT Patient Time In/Time Out  Time In: 0830  Time Out: Met[place], PT

## 2016-01-24 ENCOUNTER — Ambulatory Visit
Admit: 2016-01-24 | Discharge: 2016-01-24 | Payer: BLUE CROSS/BLUE SHIELD | Attending: Family Medicine | Primary: Nurse Practitioner

## 2016-01-24 DIAGNOSIS — J019 Acute sinusitis, unspecified: Secondary | ICD-10-CM

## 2016-01-24 MED ORDER — VALACYCLOVIR 500 MG TAB
500 mg | ORAL_TABLET | ORAL | 3 refills | Status: DC | PRN
Start: 2016-01-24 — End: 2016-09-13

## 2016-01-24 MED ORDER — CLORAZEPATE DIPOTASSIUM 3.75 MG TAB
3.75 mg | ORAL_TABLET | Freq: Two times a day (BID) | ORAL | 2 refills | Status: DC
Start: 2016-01-24 — End: 2016-09-13

## 2016-01-24 MED ORDER — AZITHROMYCIN 250 MG TAB
250 mg | ORAL_TABLET | ORAL | 1 refills | Status: DC
Start: 2016-01-24 — End: 2016-06-15

## 2016-01-24 NOTE — Progress Notes (Signed)
Ronald Fields.  11/18/1975    HPI:  Ronald Fields is a 40 y.o. male here for pain and pressure in his sinuses for the last 10 days which seemed to get worse in last few days. Now pain inteeth and yellow drainage. He also has continued anxiety well controlled with tranxene, needs refills, and stable otherwise. Due for regular check up in next few months.    Review of Systems: denies fever, chills, chest pain and shortness of breath.    Past Medical History:   Diagnosis Date   ??? Left wrist pain        Social History     Social History   ??? Marital status: MARRIED     Spouse name: N/A   ??? Number of children: N/A   ??? Years of education: N/A     Occupational History   ??? Not on file.     Social History Main Topics   ??? Smoking status: Never Smoker   ??? Smokeless tobacco: Never Used   ??? Alcohol use Yes      Comment: doesn't drink on a weekly basis   ??? Drug use: No   ??? Sexual activity: Not on file     Other Topics Concern   ??? Not on file     Social History Narrative       Family History   Problem Relation Age of Onset   ??? Cancer Father      prostate       Past Medical History:   Diagnosis Date   ??? Left wrist pain        Current Outpatient Prescriptions   Medication Sig Dispense Refill   ??? azithromycin (ZITHROMAX) 250 mg tablet Take two tablets today then one tablet daily 6 Tab 1   ??? clorazepate (TRANXENE) 3.75 mg tablet Take 1 Tab by mouth two (2) times a day. Max Daily Amount: 7.5 mg. 36 Tab 2   ??? valACYclovir (VALTREX) 500 mg tablet Take 1 Tab by mouth as needed. 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 NOREL AD 4-10-325 mg tab Take 1 Tab by mouth two (2) times a day. 30 Tab 3   ??? omega-3 fatty acids-vitamin e (FISH OIL) 1,000 mg cap Take 1 Cap by mouth daily.         No Known Allergies            Physical Exam:    Vitals: as on chart -   Visit Vitals   ??? BP 110/70   ??? Pulse 76   ??? Temp 97 ??F (36.1 ??C)   ??? Resp 16   ??? Ht _0  (1.88 m)   ??? Wt 234 lb 14.4 oz (106.5 kg)   ??? SpO2 99%    ??? BMI 30.16 kg/m2     General: Well nourished, well developed, no apparent distress.  Nose boggy, tender maxillary sinuses, throat clear, neck wnl, ears clear  Lungs: Clear to auscultation bilaterally.  CV: Regular rate and rhythm with normal S1 and S2. No murmur appreciated.  Abdomen: Normal bowel sounds. Soft, non tender, no hepatomegaly. No guarding or rebound.  Extremities: No deformity. No clubbing, cyanosis or edema.    Labs:   Results for orders placed or performed in visit on 03/15/15   CBC WITH AUTOMATED DIFF   Result Value Ref Range    WBC 7.0 3.4 - 10.8 x10E3/uL    RBC 4.63 4.14 - 5.80 x10E6/uL  HGB 13.9 12.6 - 17.7 g/dL    HCT 40.8 37.5 - 51.0 %    MCV 88 79 - 97 fL    MCH 30.0 26.6 - 33.0 pg    MCHC 34.1 31.5 - 35.7 g/dL    RDW 14.0 12.3 - 15.4 %    PLATELET 162 150 - 379 x10E3/uL    NEUTROPHILS 56 %    Lymphocytes 34 %    MONOCYTES 7 %    EOSINOPHILS 3 %    BASOPHILS 0 %    ABS. NEUTROPHILS 3.9 1.4 - 7.0 x10E3/uL    Abs Lymphocytes 2.3 0.7 - 3.1 x10E3/uL    ABS. MONOCYTES 0.5 0.1 - 0.9 x10E3/uL    ABS. EOSINOPHILS 0.2 0.0 - 0.4 x10E3/uL    ABS. BASOPHILS 0.0 0.0 - 0.2 x10E3/uL    IMMATURE GRANULOCYTES 0 %    ABS. IMM. GRANS. 0.0 0.0 - 0.1 W41L2/GM   METABOLIC PANEL, COMPREHENSIVE   Result Value Ref Range    Glucose 82 65 - 99 mg/dL    BUN 22 (H) 6 - 20 mg/dL    Creatinine 0.94 0.76 - 1.27 mg/dL    GFR est non-AA 102 >59 mL/min/1.73    GFR est AA 118 >59 mL/min/1.73    BUN/Creatinine ratio 23 (H) 8 - 19    Sodium 143 136 - 144 mmol/L    Potassium 4.7 3.5 - 5.2 mmol/L    Chloride 104 97 - 106 mmol/L    CO2 25 18 - 29 mmol/L    Calcium 8.8 8.7 - 10.2 mg/dL    Protein, total 6.9 6.0 - 8.5 g/dL    Albumin 4.1 3.5 - 5.5 g/dL    GLOBULIN, TOTAL 2.8 1.5 - 4.5 g/dL    A-G Ratio 1.5 1.1 - 2.5    Bilirubin, total 0.3 0.0 - 1.2 mg/dL    Alk. phosphatase 55 39 - 117 IU/L    AST (SGOT) 23 0 - 40 IU/L    ALT (SGPT) 15 0 - 44 IU/L   TSH 3RD GENERATION   Result Value Ref Range    TSH 3.570 0.450 - 4.500 uIU/mL    PSA DIAGNOSTIC (PROSTATIC SPECIFIC AG)   Result Value Ref Range    Prostate Specific Ag 0.9 0.0 - 4.0 ng/mL   LIPID PANEL WITH LDL/HDL RATIO   Result Value Ref Range    Cholesterol, total 193 100 - 199 mg/dL    Triglyceride 161 (H) 0 - 149 mg/dL    HDL Cholesterol 41 >39 mg/dL    VLDL, calculated 32 5 - 40 mg/dL    LDL, calculated 120 (H) 0 - 99 mg/dL    LDL/HDL Ratio 2.9 0.0 - 3.6 ratio units   HEMOGLOBIN A1C WITH EAG   Result Value Ref Range    Hemoglobin A1c 5.8 (H) 4.8 - 5.6 %    Estimated average glucose 120 mg/dL       Assessment/Plan:     1. Situational anxiety  Discussed controlled use of meds and if needs more regularly , may need other meds  - clorazepate (TRANXENE) 3.75 mg tablet; Take 1 Tab by mouth two (2) times a day. Max Daily Amount: 7.5 mg.  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 Indications: SUPPRESSION OF RECURRENT HERPES SIMPLEX INFECTION  Dispense: 30 Tab; Refill: 3    3.  Acute sinusitis, recurrence not specified, unspecified location  Discussed use of abx, and supportive care, with otc meds, and fluids      Orders Placed This Encounter   ??? azithromycin (ZITHROMAX) 250 mg tablet     Sig: Take two tablets today then one tablet daily     Dispense:  6 Tab     Refill:  1   ??? clorazepate (TRANXENE) 3.75 mg tablet     Sig: Take 1 Tab by mouth two (2) times a day. Max Daily Amount: 7.5 mg.     Dispense:  36 Tab     Refill:  2   ??? valACYclovir (VALTREX) 500 mg tablet     Sig: Take 1 Tab by mouth as needed. 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 Proofread, but unrecognized voice recognition errors may exist.

## 2016-06-15 ENCOUNTER — Ambulatory Visit
Admit: 2016-06-15 | Discharge: 2016-06-15 | Payer: BLUE CROSS/BLUE SHIELD | Attending: Family Medicine | Primary: Nurse Practitioner

## 2016-06-15 DIAGNOSIS — J069 Acute upper respiratory infection, unspecified: Secondary | ICD-10-CM

## 2016-06-15 MED ORDER — FLUTICASONE 50 MCG/ACTUATION NASAL SPRAY, SUSP
50 mcg/actuation | Freq: Every day | NASAL | 2 refills | Status: DC
Start: 2016-06-15 — End: 2016-09-13

## 2016-06-15 MED ORDER — AZITHROMYCIN 250 MG TAB
250 mg | ORAL_TABLET | ORAL | 1 refills | Status: DC
Start: 2016-06-15 — End: 2016-09-13

## 2016-06-15 NOTE — Progress Notes (Signed)
Subjective:      Ronald Fields. is a 41 y.o. male who presents for   Chief Complaint   Patient presents with   ??? Flu   .      HPI  Patient comes today c/o feeling sick for 2-3 days- c/o congestion, cough with lots of phlegm, bodyaches, scratchy sore throat, fever 2 days ago- Tmax was 101. He denies any sick contacts except his child had a URI last week. He took Norel AD and some Ibuprofen, Chloraseptic sprays and lozenges, Saline NS at night.    PHQ over the last two weeks 06/15/2016   Little interest or pleasure in doing things Not at all   Feeling down, depressed or hopeless Not at all   Total Score PHQ 2 0        Past Medical History:   Diagnosis Date   ??? Left wrist pain      Past Surgical History:   Procedure Laterality Date   ??? HX OTHER SURGICAL      liposuction under arms   ??? HX OTHER SURGICAL      LEFT WRIST GANGLION CYST   ??? HX OTHER SURGICAL Left 02/04/2015    Torn left bicep tendon repair      Family History   Problem Relation Age of Onset   ??? Cancer Father      prostate     Social History   Substance Use Topics   ??? Smoking status: Never Smoker   ??? Smokeless tobacco: Never Used   ??? Alcohol use Yes      Comment: doesn't drink on a weekly basis      Prior to Admission medications    Medication Sig Start Date End Date Taking? Authorizing Provider   azithromycin (ZITHROMAX) 250 mg tablet Take two tablets today then one tablet daily 06/15/16  Yes Hajer Dwyer Ammie Dalton, MD   fluticasone (FLONASE) 50 mcg/actuation nasal spray 2 Sprays by Both Nostrils route daily. 06/15/16  Yes Ayriana Wix Ammie Dalton, MD   clorazepate (TRANXENE) 3.75 mg tablet Take 1 Tab by mouth two (2) times a day. Max Daily Amount: 7.5 mg. 01/24/16  Yes Shelda Jakes, MD   valACYclovir (VALTREX) 500 mg tablet Take 1 Tab by mouth as needed. 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 07/18/15  Yes Shelda Jakes, MD    omega-3 fatty acids-vitamin e (FISH OIL) 1,000 mg cap Take 1 Cap by mouth daily.   Yes Historical Provider        No Known Allergies       Review of Systems   Constitutional: Positive for fever. Negative for chills and malaise/fatigue.   HENT: Positive for congestion and sore throat. Negative for ear discharge and ear pain.    Respiratory: Positive for cough. Negative for shortness of breath and wheezing.    Cardiovascular: Negative for chest pain, palpitations and leg swelling.   Gastrointestinal: Negative for abdominal pain, diarrhea, nausea and vomiting.   Musculoskeletal: Positive for myalgias.   Skin: Negative for rash.   Neurological: Negative for headaches.           Objective:     Vitals:    06/15/16 0930   BP: 121/80   Pulse: (!) 56   Resp: 16   Temp: 99.1 ??F (37.3 ??C)   SpO2: 97%   Weight: 228 lb (  103.4 kg)   Height: 6\' 2"  (1.88 m)        Physical Exam   Constitutional: He appears distressed.   HENT:   B/l tympanic membranes are congested, nose is congested, no pharyngeal erythema, no sinus tenderness   Eyes: Conjunctivae are normal. Pupils are equal, round, and reactive to light.   Neck: Neck supple.   Cardiovascular: Normal rate and regular rhythm.    Pulmonary/Chest: Effort normal and breath sounds normal.   Lymphadenopathy:     He has no cervical adenopathy.         Assessment & Plan:       ICD-10-CM ICD-9-CM    1. Acute URI J06.9 465.9      Flu test is negative.  Advised patient to drink plenty of fluids, rest, to use over-the-counter saline nasal sprays 3-4 times a day, to do steam inhalations, may use over-the-counter cough and cold medicines. Start Z- pak.    Orders Placed This Encounter   ??? azithromycin (ZITHROMAX) 250 mg tablet     Sig: Take two tablets today then one tablet daily     Dispense:  6 Tab     Refill:  1   ??? fluticasone (FLONASE) 50 mcg/actuation nasal spray     Sig: 2 Sprays by Both Nostrils route daily.     Dispense:  1 Bottle     Refill:  2          Signed By: Gelene Minkivya S Elisha Cooksey, MD     June 15, 2016

## 2016-09-13 ENCOUNTER — Ambulatory Visit
Admit: 2016-09-13 | Discharge: 2016-09-13 | Payer: BLUE CROSS/BLUE SHIELD | Attending: Nurse Practitioner | Primary: Nurse Practitioner

## 2016-09-13 DIAGNOSIS — Z0000 Encounter for general adult medical examination without abnormal findings: Secondary | ICD-10-CM

## 2016-09-13 MED ORDER — NOREL AD 4 MG-10 MG-325 MG TABLET
4-10-325 mg | ORAL_TABLET | Freq: Two times a day (BID) | ORAL | 5 refills | Status: AC
Start: 2016-09-13 — End: ?

## 2016-09-13 MED ORDER — CLORAZEPATE DIPOTASSIUM 3.75 MG TAB
3.75 mg | ORAL_TABLET | Freq: Two times a day (BID) | ORAL | 5 refills | Status: AC
Start: 2016-09-13 — End: ?

## 2016-09-13 MED ORDER — FLUTICASONE 50 MCG/ACTUATION NASAL SPRAY, SUSP
50 mcg/actuation | Freq: Every day | NASAL | 11 refills | Status: AC
Start: 2016-09-13 — End: ?

## 2016-09-13 MED ORDER — VALACYCLOVIR 500 MG TAB
500 mg | ORAL_TABLET | ORAL | 11 refills | Status: AC | PRN
Start: 2016-09-13 — End: ?

## 2016-09-13 NOTE — Patient Instructions (Addendum)
A Healthy Lifestyle: Care Instructions  Your Care Instructions    A healthy lifestyle can help you feel good, stay at a healthy weight, and have plenty of energy for both work and play. A healthy lifestyle is something you can share with your whole family.  A healthy lifestyle also can lower your risk for serious health problems, such as high blood pressure, heart disease, and diabetes.  You can follow a few steps listed below to improve your health and the health of your family.  Follow-up care is a key part of your treatment and safety. Be sure to make and go to all appointments, and call your doctor if you are having problems. It's also a good idea to know your test results and keep a list of the medicines you take.  How can you care for yourself at home?  ?? Do not eat too much sugar, fat, or fast foods. You can still have dessert and treats now and then. The goal is moderation.  ?? Start small to improve your eating habits. Pay attention to portion sizes, drink less juice and soda pop, and eat more fruits and vegetables.  ?? Eat a healthy amount of food. A 3-ounce serving of meat, for example, is about the size of a deck of cards. Fill the rest of your plate with vegetables and whole grains.  ?? Limit the amount of soda and sports drinks you have every day. Drink more water when you are thirsty.  ?? Eat at least 5 servings of fruits and vegetables every day. It may seem like a lot, but it is not hard to reach this goal. A serving or helping is 1 piece of fruit, 1 cup of vegetables, or 2 cups of leafy, raw vegetables. Have an apple or some carrot sticks as an afternoon snack instead of a candy bar. Try to have fruits and/or vegetables at every meal.  ?? Make exercise part of your daily routine. You may want to start with simple activities, such as walking, bicycling, or slow swimming. Try to be active 30 to 60 minutes every day. You do not need to do all 30 to 60  minutes all at once. For example, you can exercise 3 times a day for 10 or 20 minutes. Moderate exercise is safe for most people, but it is always a good idea to talk to your doctor before starting an exercise program.  ?? Keep moving. Mow the lawn, work in the garden, or clean your house. Take the stairs instead of the elevator at work.  ?? If you smoke, quit. People who smoke have an increased risk for heart attack, stroke, cancer, and other lung illnesses. Quitting is hard, but there are ways to boost your chance of quitting tobacco for good.  ?? Use nicotine gum, patches, or lozenges.  ?? Ask your doctor about stop-smoking programs and medicines.  ?? Keep trying.  In addition to reducing your risk of diseases in the future, you will notice some benefits soon after you stop using tobacco. If you have shortness of breath or asthma symptoms, they will likely get better within a few weeks after you quit.  ?? Limit how much alcohol you drink. Moderate amounts of alcohol (up to 2 drinks a day for men, 1 drink a day for women) are okay. But drinking too much can lead to liver problems, high blood pressure, and other health problems.  Family health  If you have a family, there are many things you   can do together to improve your health.  ?? Eat meals together as a family as often as possible.  ?? Eat healthy foods. This includes fruits, vegetables, lean meats and dairy, and whole grains.  ?? Include your family in your fitness plan. Most people think of activities such as jogging or tennis as the way to fitness, but there are many ways you and your family can be more active. Anything that makes you breathe hard and gets your heart pumping is exercise. Here are some tips:  ?? Walk to do errands or to take your child to school or the bus.  ?? Go for a family bike ride after dinner instead of watching TV.  Where can you learn more?  Go to http://www.healthwise.net/[employer].   Enter U807 in the search box to learn more about "A Healthy Lifestyle: Care Instructions."  Current as of: Aug 26, 2015  Content Version: 11.4  ?? 2006-2017 [employer]. Care instructions adapted under license by [employer] (which disclaims liability or warranty for this information). If you have questions about a medical condition or this instruction, always ask your healthcare professional. Healthwise, Incorporated disclaims any warranty or liability for your use of this information.

## 2016-09-13 NOTE — Progress Notes (Signed)
CHIEF COMPLAINT:   Chief Complaint   Patient presents with   ??? Physical   ??? Labs     HISTORY OF PRESENT ILLNESS: Mr. Mittelstaedt is a 41 y.o. WHITE OR CAUCASIAN male with need for CPE.  He is feeling well and has no current complaints. Cholesterol readings have been high in the past.  BMI is elevated at 30.  Doesn't do regular exercise but tries to stay active. Has PRN meds for HSV 1, intermittent insomnia and intermittent anxiety and seasonal allergic rhinitis. Got a flu shot at work last fall.   He has no need for refills. He recently had biceps tendon repair a couple of years ago and has done well with that.  Has a strong family h/o prostate cancer and DRE last year was normal. He has also had a h/o vitamin D deficiency and elevated A1C in the past.        HISTORY:  No Known Allergies  Past Medical History:   Diagnosis Date   ??? Left wrist pain      Past Surgical History:   Procedure Laterality Date   ??? HX OTHER SURGICAL      liposuction under arms   ??? HX OTHER SURGICAL      LEFT WRIST GANGLION CYST   ??? HX OTHER SURGICAL Left 02/04/2015    Torn left bicep tendon repair     Family History   Problem Relation Age of Onset   ??? Cancer Father      prostate     Social History     Social History   ??? Marital status: MARRIED     Spouse name: N/A   ??? Number of children: N/A   ??? Years of education: N/A     Occupational History   ??? Not on file.     Social History Main Topics   ??? Smoking status: Never Smoker   ??? Smokeless tobacco: Never Used   ??? Alcohol use No      Comment: doesn't drink on a weekly basis   ??? Drug use: No   ??? Sexual activity: Not on file     Other Topics Concern   ??? Not on file     Social History Narrative     Current Outpatient Prescriptions   Medication Sig Dispense Refill   ??? fluticasone (FLONASE) 50 mcg/actuation nasal spray 2 Sprays by Both Nostrils route daily. 1 Bottle 11   ??? clorazepate (TRANXENE) 3.75 mg tablet Take 1 Tab by mouth two (2) times a day. Max Daily Amount: 7.5 mg. 36 Tab 5    ??? valACYclovir (VALTREX) 500 mg tablet Take 1 Tab by mouth as needed. Indications: SUPPRESSION OF RECURRENT HERPES SIMPLEX INFECTION 30 Tab 11   ??? NOREL AD 4-10-325 mg tab Take 1 Tab by mouth two (2) times a day. 30 Tab 5       REVIEW OF SYSTEMS:   GENERAL/CONSTITUTIONAL: Negative for  - chills, fatigue, fever, night sweats, sleep disturbance, weight changes  HEAD, EYES, EARS, NOSE AND THROAT: Negative for - headaches, hearing change,  oral lesions, sinus pain, sore throat, vertigo, visual changes  CARDIOVASCULAR: Negative for - chest pain, edema, irregular heartbeat, loss of consciousness, orthopnea, palpitations, PND, DOE  RESPIRATORY:  Negative for - hemoptysis, orthopnea, pleuritic pain, shortness of breath, tachypnea, wheezing  GASTROINTESTINAL: Negative for - abdominal pain, appetite loss, blood in stools, change in bowel habits, heartburn, hematemesis, melena, dysphagia  GENITOURINARY: Negative for - dysuria, hematuria  MUSCULOSKELETAL: Negative  for - gait disturbance, joint pain, joint stiffness, joint swelling  Positive for-improving post op muscle pain and muscular weakness  SKIN:  Negative for -  hair changes, mole changes, pruritus, rash, skin lesion changes  NEUROLOGIC: Negative for - behavioral changes, confusion, dizziness, gait disturbance,  impaired coordination/balance, memory loss, seizures, speech problems  PSYCHIATRIC: Negative for - anxiety, behavioral disorder, concentration difficulties, depression  ENDOCRINE:  Negative for - malaise/lethargy, polydipsia/polyuria, skin changes, unexpected weight changes  HEMATOLOGIC/LYMPHATIC:  Negative for - bleeding problems, bruising, fatigue, jaundice, night sweats, pallor  ALLERGIC/IMMUNOLOGIC:  Positive for -  nasal congestion, postnasal drip, seasonal allergies    PHYSICAL EXAM:  Vital Signs -   Visit Vitals   ??? BP 108/67   ??? Pulse 67   ??? Temp 96.6 ??F (35.9 ??C) (Tympanic)   ??? Resp 16   ??? Ht 6' 2"  (1.88 m)   ??? Wt 234 lb (106.1 kg)   ??? SpO2 97%    ??? BMI 30.04 kg/m2      Constitutional - alert, well appearing, and in no distress.  Eyes - pupils equal and reactive, extraocular eye movements intact. Conjunctiva pink, sclera clear.   Ear, Nose, Mouth, Throat - external inspection of ears and nose is normal. OP normal.   Neck - supple, no significant adenopathy. No thyromegaly.  Respiratory - clear to auscultation, no wheezes, rales or rhonchi, respirations nonlabored.  Cardiovascular - S1S2 RRR no M/R/G no pedal edema +2pp, no carotid bruits.   Gastrointestinal - Abdomen soft, non tender, non distended, no masses.normoactive BSx4.   Examination of liver and spleen no organomegaly    Genitourinary : negative CVA tenderness  Musculoskeletal - No joint tenderness, deformity or swelling. MAEW. Normal and equal grip.   Skin - no rash, warm and dry  Neurological -. Motor sensory and gait is normal . Speech is normal  Extremities - peripheral pulses normal, no pedal edema, no clubbing or cyanosis  Psychiatric - alert, oriented to person, place, and time.      LABS  Results for orders placed or performed in visit on 09/13/16   CBC WITH AUTOMATED DIFF   Result Value Ref Range    WBC 6.4 3.4 - 10.8 x10E3/uL    RBC 4.65 4.14 - 5.80 x10E6/uL    HGB 14.1 13.0 - 17.7 g/dL    HCT 41.3 37.5 - 51.0 %    MCV 89 79 - 97 fL    MCH 30.3 26.6 - 33.0 pg    MCHC 34.1 31.5 - 35.7 g/dL    RDW 13.8 12.3 - 15.4 %    PLATELET 182 150 - 379 x10E3/uL    NEUTROPHILS 46 Not Estab. %    Lymphocytes 42 Not Estab. %    MONOCYTES 8 Not Estab. %    EOSINOPHILS 4 Not Estab. %    BASOPHILS 0 Not Estab. %    ABS. NEUTROPHILS 2.9 1.4 - 7.0 x10E3/uL    Abs Lymphocytes 2.6 0.7 - 3.1 x10E3/uL    ABS. MONOCYTES 0.5 0.1 - 0.9 x10E3/uL    ABS. EOSINOPHILS 0.2 0.0 - 0.4 x10E3/uL    ABS. BASOPHILS 0.0 0.0 - 0.2 x10E3/uL    IMMATURE GRANULOCYTES 0 Not Estab. %    ABS. IMM. GRANS. 0.0 0.0 - 0.1 [id number]/SA   METABOLIC PANEL, COMPREHENSIVE   Result Value Ref Range    Glucose 94 65 - 99 mg/dL    BUN 24 6 - 24 mg/dL     Creatinine 0.99 0.76 -  1.27 mg/dL    GFR est non-AA 94 >59 mL/min/1.73    GFR est AA 109 >59 mL/min/1.73    BUN/Creatinine ratio 24 (H) 9 - 20    Sodium 141 134 - 144 mmol/L    Potassium 5.0 3.5 - 5.2 mmol/L    Chloride 103 96 - 106 mmol/L    CO2 25 18 - 29 mmol/L    Calcium 9.2 8.7 - 10.2 mg/dL    Protein, total 7.1 6.0 - 8.5 g/dL    Albumin 4.2 3.5 - 5.5 g/dL    GLOBULIN, TOTAL 2.9 1.5 - 4.5 g/dL    A-G Ratio 1.4 1.2 - 2.2    Bilirubin, total 0.3 0.0 - 1.2 mg/dL    Alk. phosphatase 50 39 - 117 IU/L    AST (SGOT) 22 0 - 40 IU/L    ALT (SGPT) 12 0 - 44 IU/L   LIPID PANEL WITH LDL/HDL RATIO   Result Value Ref Range    Cholesterol, total 187 100 - 199 mg/dL    Triglyceride 192 (H) 0 - 149 mg/dL    HDL Cholesterol 38 (L) >39 mg/dL    VLDL, calculated 38 5 - 40 mg/dL    LDL, calculated 111 (H) 0 - 99 mg/dL    LDL/HDL Ratio 2.9 0.0 - 3.6 ratio   PSA, DIAGNOSTIC (PROSTATE SPECIFIC AG)   Result Value Ref Range    Prostate Specific Ag 0.8 0.0 - 4.0 ng/mL   VITAMIN D, 25 HYDROXY   Result Value Ref Range    VITAMIN D, 25-HYDROXY 27.3 (L) 30.0 - 100.0 ng/mL   TSH 3RD GENERATION   Result Value Ref Range    TSH 5.300 (H) 0.450 - 4.500 uIU/mL   HEMOGLOBIN A1C WITH EAG   Result Value Ref Range    Hemoglobin A1c 5.3 4.8 - 5.6 %    Estimated average glucose 105 mg/dL           IMPRESSION    ICD-10-CM ICD-9-CM    1. Routine general medical examination at a health care facility Z00.00 V70.0 COLLECTION VENOUS BLOOD,VENIPUNCTURE      CBC WITH AUTOMATED DIFF      METABOLIC PANEL, COMPREHENSIVE      LIPID PANEL WITH LDL/HDL RATIO      PSA, DIAGNOSTIC (PROSTATE SPECIFIC AG)      VITAMIN D, 25 HYDROXY      TSH 3RD GENERATION      HEMOGLOBIN A1C WITH EAG   2. Polyuria R35.8 788.42 COLLECTION VENOUS BLOOD,VENIPUNCTURE      CBC WITH AUTOMATED DIFF      METABOLIC PANEL, COMPREHENSIVE      LIPID PANEL WITH LDL/HDL RATIO      PSA, DIAGNOSTIC (PROSTATE SPECIFIC AG)      VITAMIN D, 25 HYDROXY      TSH 3RD GENERATION      HEMOGLOBIN A1C WITH EAG    3. Class 1 obesity due to excess calories without serious comorbidity with body mass index (BMI) of 30.0 to 30.9 in adult E66.09 278.00 COLLECTION VENOUS BLOOD,VENIPUNCTURE    Z68.30 V85.30 CBC WITH AUTOMATED DIFF      METABOLIC PANEL, COMPREHENSIVE      LIPID PANEL WITH LDL/HDL RATIO      PSA, DIAGNOSTIC (PROSTATE SPECIFIC AG)      VITAMIN D, 25 HYDROXY      TSH 3RD GENERATION      HEMOGLOBIN A1C WITH EAG   4. Elevated hemoglobin A1c R73.09 790.29 COLLECTION VENOUS BLOOD,VENIPUNCTURE  CBC WITH AUTOMATED DIFF      METABOLIC PANEL, COMPREHENSIVE      LIPID PANEL WITH LDL/HDL RATIO      PSA, DIAGNOSTIC (PROSTATE SPECIFIC AG)      VITAMIN D, 25 HYDROXY      TSH 3RD GENERATION      HEMOGLOBIN A1C WITH EAG   5. Vitamin D deficiency E55.9 268.9 COLLECTION VENOUS BLOOD,VENIPUNCTURE      CBC WITH AUTOMATED DIFF      METABOLIC PANEL, COMPREHENSIVE      LIPID PANEL WITH LDL/HDL RATIO      PSA, DIAGNOSTIC (PROSTATE SPECIFIC AG)      VITAMIN D, 25 HYDROXY      TSH 3RD GENERATION      HEMOGLOBIN A1C WITH EAG   6. Family history of prostate cancer Z80.42 V16.42 PSA, DIAGNOSTIC (PROSTATE SPECIFIC AG)   7. Situational anxiety F41.8 300.09 clorazepate (TRANXENE) 3.75 mg tablet   8. HSV-1 (herpes simplex virus 1) infection B00.9 054.9 valACYclovir (VALTREX) 500 mg tablet       PLAN :See orders above.  Patient instructed to call with any questions concerns or progression in symptoms.  Healthy lifestyle measures re-iterated and elevated BMI is discussed with regular walking program- 30 min 5 days a week as well as Mediterranean Diet with calorie restriction for weight loss. Can try Fitness Pal app. Weight loss medications also reviewed as an option.  Discussed wellness goals- regular exercise, Meditteranean Diet, and will f/u on labs especially cholesterol levels.   Discussed family h/o prostate cancer- include PSA.   F/u after above and o/w in 1 year or PRN.       Monia Sabal, NP           Dictated using voice recognition software. Proofread, but unrecognized voice recognition errors may exist.

## 2016-09-14 LAB — LIPID PANEL WITH LDL/HDL RATIO
Cholesterol, total: 187 mg/dL (ref 100–199)
HDL Cholesterol: 38 mg/dL — ABNORMAL LOW (ref >39)
LDL, calculated: 111 mg/dL — ABNORMAL HIGH (ref 0–99)
LDL/HDL Ratio: 2.9 ratio (ref 0.0–3.6)
Triglyceride: 192 mg/dL — ABNORMAL HIGH (ref 0–149)
VLDL, calculated: 38 mg/dL (ref 5–40)

## 2016-09-14 LAB — CBC WITH AUTOMATED DIFF
ABS. BASOPHILS: 0 10*3/uL (ref 0.0–0.2)
ABS. EOSINOPHILS: 0.2 10*3/uL (ref 0.0–0.4)
ABS. IMM. GRANS.: 0 10*3/uL (ref 0.0–0.1)
ABS. MONOCYTES: 0.5 10*3/uL (ref 0.1–0.9)
ABS. NEUTROPHILS: 2.9 10*3/uL (ref 1.4–7.0)
Abs Lymphocytes: 2.6 10*3/uL (ref 0.7–3.1)
BASOPHILS: 0 %
EOSINOPHILS: 4 %
HCT: 41.3 % (ref 37.5–51.0)
HGB: 14.1 g/dL (ref 13.0–17.7)
IMMATURE GRANULOCYTES: 0 %
Lymphocytes: 42 %
MCH: 30.3 pg (ref 26.6–33.0)
MCHC: 34.1 g/dL (ref 31.5–35.7)
MCV: 89 fL (ref 79–97)
MONOCYTES: 8 %
NEUTROPHILS: 46 %
PLATELET: 182 10*3/uL (ref 150–379)
RBC: 4.65 x10E6/uL (ref 4.14–5.80)
RDW: 13.8 % (ref 12.3–15.4)
WBC: 6.4 10*3/uL (ref 3.4–10.8)

## 2016-09-14 LAB — METABOLIC PANEL, COMPREHENSIVE
A-G Ratio: 1.4 (ref 1.2–2.2)
ALT (SGPT): 12 IU/L (ref 0–44)
AST (SGOT): 22 IU/L (ref 0–40)
Albumin: 4.2 g/dL (ref 3.5–5.5)
Alk. phosphatase: 50 IU/L (ref 39–117)
BUN/Creatinine ratio: 24 — ABNORMAL HIGH (ref 9–20)
BUN: 24 mg/dL (ref 6–24)
Bilirubin, total: 0.3 mg/dL (ref 0.0–1.2)
CO2: 25 mmol/L (ref 18–29)
Calcium: 9.2 mg/dL (ref 8.7–10.2)
Chloride: 103 mmol/L (ref 96–106)
Creatinine: 0.99 mg/dL (ref 0.76–1.27)
GFR est AA: 109 mL/min/{1.73_m2} (ref >59)
GFR est non-AA: 94 mL/min/{1.73_m2} (ref >59)
GLOBULIN, TOTAL: 2.9 g/dL (ref 1.5–4.5)
Glucose: 94 mg/dL (ref 65–99)
Potassium: 5 mmol/L (ref 3.5–5.2)
Protein, total: 7.1 g/dL (ref 6.0–8.5)
Sodium: 141 mmol/L (ref 134–144)

## 2016-09-14 LAB — HEMOGLOBIN A1C WITH EAG
Estimated average glucose: 105 mg/dL
Hemoglobin A1c: 5.3 % (ref 4.8–5.6)

## 2016-09-14 LAB — TSH 3RD GENERATION: TSH: 5.3 u[IU]/mL — ABNORMAL HIGH (ref 0.450–4.500)

## 2016-09-14 LAB — PSA, DIAGNOSTIC (PROSTATE SPECIFIC AG): Prostate Specific Ag: 0.8 ng/mL (ref 0.0–4.0)

## 2016-09-14 LAB — VITAMIN D, 25 HYDROXY: VITAMIN D, 25-HYDROXY: 27.3 ng/mL — ABNORMAL LOW (ref 30.0–100.0)

## 2016-10-18 NOTE — Progress Notes (Signed)
Please call paitent- sorry for delay in response but labs from physical overall were ok, but some minor things to discuss. CBC and CMP were normal. PSA was normal and A1C diabetes screening test was normal/negative- it has been slightly elevated in the past, so this is great!  However, your vitamin D was still slightly low- you should up make sure to take 2000 units of OTC vitamin D 3 a day for this.   Your cholesterol showed only a mild elevation to LDL (111 and goal is < 100 and this is improved from 120 before) However TRG were slightly up at 192 and good cholesterol was slightly low at 38- these numbers however, are likely being influenced by an underactive thyroid as your TSH was abnormal and showing a slight "sluggishness" in your thyroid. This can make your energy down and make it harder to lose weight and cause numerous other symptoms.   You can come in to discuss further or we can recheck in 6 weeks. Chancy MilroyLisa Dumas Horace Wishon, NP

## 2016-10-26 NOTE — Progress Notes (Signed)
Left detailed message on secure voicemail regarding lab results and to schedule an appointment with Roxine CaddyLisa Gantt, NP in 6 weeks for fasting labs and follow up.

## 2017-01-31 ENCOUNTER — Encounter: Payer: Self-pay | Admitting: Family Medicine

## 2017-01-31 ENCOUNTER — Ambulatory Visit (INDEPENDENT_AMBULATORY_CARE_PROVIDER_SITE_OTHER): Payer: BLUE CROSS/BLUE SHIELD | Admitting: Family Medicine

## 2017-01-31 VITALS — BP 100/70 | HR 90 | Ht <= 58 in | Wt 216.8 lb

## 2017-01-31 DIAGNOSIS — J45909 Unspecified asthma, uncomplicated: Secondary | ICD-10-CM | POA: Diagnosis not present

## 2017-01-31 DIAGNOSIS — Z8619 Personal history of other infectious and parasitic diseases: Secondary | ICD-10-CM

## 2017-01-31 DIAGNOSIS — J40 Bronchitis, not specified as acute or chronic: Secondary | ICD-10-CM

## 2017-01-31 DIAGNOSIS — F419 Anxiety disorder, unspecified: Secondary | ICD-10-CM | POA: Diagnosis not present

## 2017-01-31 MED ORDER — VALACYCLOVIR HCL 500 MG PO TABS
ORAL_TABLET | ORAL | 0 refills | Status: DC
Start: 2017-01-31 — End: 2017-02-27

## 2017-01-31 MED ORDER — IPRATROPIUM BROMIDE 0.03 % NA SOLN: 2.0000 | Freq: Two times a day (BID) | NASAL | 12 refills | Status: DC

## 2017-01-31 MED ORDER — CLORAZEPATE DIPOTASSIUM 3.75 MG PO TABS: 3.7500 mg | ORAL_TABLET | Freq: Two times a day (BID) | ORAL | 0 refills | Status: DC | PRN

## 2017-01-31 MED ORDER — AZITHROMYCIN 250 MG PO TABS: ORAL_TABLET | ORAL | 0 refills | Status: DC

## 2017-01-31 MED ORDER — ALBUTEROL SULFATE HFA 108 (90 BASE) MCG/ACT IN AERS: 1.0000 | INHALATION_SPRAY | Freq: Four times a day (QID) | RESPIRATORY_TRACT | 0 refills | Status: AC | PRN

## 2017-01-31 NOTE — Progress Notes (Signed)
Subjective:  Roberto Wilson is a 41 y.o. male who presents today with a chief complaint of chest congestion and to establish care.   HPI:  Chest Congestion, Acute Issue Started about 10 days ago. Gotten worse then better. No fevers. Dark green phlegm. Some shortness of breath. No sick contacts. Albuterol did not help. Tried OTC remedies which did not help.   Anxiety, Chronic Problem Diagnosed several years ago.Takes clorazepate twice daily. Currently stable on this dose.   Possible Asthma, Chronic Problem Diagnosed as an adult during a sleep study. Currently uses albuterol as needed.    ROS: Per HPI, otherwise a 14 point review of systems was performed and was negative  PMH:  The following were reviewed and entered/updated in epic: Past Medical History:  Diagnosis Date  . GERD (gastroesophageal reflux disease)   . Hyperlipidemia    Patient Active Problem List   Diagnosis Date Noted  . Anxiety disorder 02/01/2017  . Asthma 02/01/2017  . 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 OSA (obstructive sleep apnea) 02/01/2017   Past Surgical History:  Procedure Laterality Date  . BICEPS TENDON REPAIR Left 2016  . GANGLION CYST EXCISION Left 2016    Family History  Problem Relation Age of Onset  . Hearing loss Mother   . Cancer Father        Prostate  . Arthritis Maternal Grandmother   . Hypertension Maternal Grandmother   . Diabetes Paternal Grandmother   . Cancer Paternal Grandfather        Prostate    Medications- reviewed and updated Current Outpatient Prescriptions  Medication Sig Dispense Refill  . albuterol (PROAIR HFA) 108 (90 Base) MCG/ACT inhaler Inhale 1-2 puffs into the lungs every 6 (six) hours as needed. 1 Inhaler 0  . Chlorphen-PE-Acetaminophen (NOREL AD PO) Take by mouth.    . clorazepate (TRANXENE) 3.75 MG tablet Take 1 tablet (3.75 mg total) by mouth 2 (two) times daily as needed for anxiety. 60 tablet 0  . valACYclovir (VALTREX) 500 MG tablet valacyclovir  500 mg tablet as needed 30 tablet 0  . azithromycin (ZITHROMAX) 250 MG tablet Take 2 tabs day 1, then 1 tab daily 6 each 0  . ipratropium (ATROVENT) 0.03 % nasal spray Place 2 sprays into both nostrils every 12 (twelve) hours. 30 mL 12   No current facility-administered medications for this visit.     Allergies-reviewed and updated No Known Allergies  Social History   Social History  . Marital status: Married    Spouse name: N/A  . Number of children: N/A  . Years of education: N/A   Occupational History  . Operations Manager    Social History Main Topics  . Smoking status: Never Smoker  . Smokeless tobacco: Never Used  . Alcohol use Yes     Comment: Socially  . Drug use: No  . Sexual activity: Yes    Partners: Female   Other Topics Concern  . None   Social History Narrative  . None     Objective:  Physical Exam: BP 100/70   Pulse 90   Ht 4' (1.219 m)   Wt 216 lb 12.8 oz (98.3 kg)   SpO2 98%   BMI 66.16 kg/m   Gen: NAD, resting comfortably HEENT: TMs clear bilaterally.  Oropharynx clear.  No lymphadenopathy. CV: RRR with no murmurs appreciated Pulm: NWOB.  Occasional rhonchi noted.  No wheeze. GI: Normal bowel sounds present. Soft, Nontender, Nondistended. MSK: No edema, cyanosis, or clubbing noted Skin:  Warm, dry Neuro: Grossly normal, moves all extremities Psych: Normal affect and thought content  Assessment/Plan:  Anxiety disorder Clorazepate refilled today.  Controlled substance contract reviewed and signed.  Database reviewed without red flags.  Obtain records from prior PCP.  H/O cold sores Valtrex refilled today.  Asthma Patient is unsure if this is an actual diagnosis.  Respiratory status normal today.  Albuterol refilled today.  Consider PFTs in the near future.  Bronchitis Given 10-day history, will start Z-Pak today.  Also start Atrovent nasal spray.  Continue albuterol as needed.  He does not have any wheezing on today's exam-no need for  steroids at this time.  Return precautions reviewed.  Follow-up as needed.   Preventative health care Flu shot deferred today.  Will follow-up in a few months for CPE.  Will need screening lab work at that time.  Roberto Degree. Jimmey Ralph, MD 02/01/2017 8:35 AM

## 2017-01-31 NOTE — Patient Instructions (Signed)
Start the zpack and atrovent.  Let me know if not improving.  Come back to see me in 4-5 months, or sooner as needed.  Take care,  Dr Jimmey RalphParker

## 2017-02-01 ENCOUNTER — Encounter: Payer: Self-pay | Admitting: Family Medicine

## 2017-02-01 DIAGNOSIS — F419 Anxiety disorder, unspecified: Secondary | ICD-10-CM | POA: Insufficient documentation

## 2017-02-01 DIAGNOSIS — J45909 Unspecified asthma, uncomplicated: Secondary | ICD-10-CM | POA: Insufficient documentation

## 2017-02-01 DIAGNOSIS — G4733 Obstructive sleep apnea (adult) (pediatric): Secondary | ICD-10-CM | POA: Insufficient documentation

## 2017-02-01 DIAGNOSIS — Z8619 Personal history of other infectious and parasitic diseases: Secondary | ICD-10-CM | POA: Insufficient documentation

## 2017-02-01 NOTE — Assessment & Plan Note (Signed)
Valtrex refilled today 

## 2017-02-01 NOTE — Assessment & Plan Note (Signed)
Patient is unsure if this is an actual diagnosis.  Respiratory status normal today.  Albuterol refilled today.  Consider PFTs in the near future.

## 2017-02-01 NOTE — Assessment & Plan Note (Signed)
Clorazepate refilled today.  Controlled substance contract reviewed and signed.  Database reviewed without red flags.  Obtain records from prior PCP.

## 2017-02-11 ENCOUNTER — Telehealth: Payer: Self-pay | Admitting: Family Medicine

## 2017-02-11 NOTE — Telephone Encounter (Signed)
[facility]

## 2017-02-21 ENCOUNTER — Ambulatory Visit: Payer: BLUE CROSS/BLUE SHIELD | Admitting: Family Medicine

## 2017-02-21 ENCOUNTER — Encounter: Payer: Self-pay | Admitting: Family Medicine

## 2017-02-21 VITALS — BP 110/70 | HR 79 | Ht 73.0 in | Wt 215.2 lb

## 2017-02-21 DIAGNOSIS — R079 Chest pain, unspecified: Secondary | ICD-10-CM | POA: Diagnosis not present

## 2017-02-21 NOTE — Progress Notes (Signed)
   Subjective:  Roberto Wilson is a 41 y.o. male who presents today with a chief complaint of chest pain.   HPI:  Chest Pain, Acute Issue Symptoms started 2 days ago. Had one episode that lasted about 10 minutes on the left side of his chest.  Pain felt like a pressure sensation.  He also felt some dizziness, fatigue and jaw pain with episode.  Mild shortness of breath.  Never had any similar episodes in the past.  Has not had any chest pain since the event.  Patient was sitting when the symptoms occurred.  No obvious precipitating events.  No family history of early cardiac disease.  No symptoms with exertion.  He was able to have a full workout a few days ago without any sort of symptoms.  ROS: Per HPI  PMH: Smoking history reviewed. Never smoker.   Objective:  Physical Exam: BP 110/70   Pulse 79   Ht 6\' 1"  (1.854 m)   Wt 215 lb 3.2 oz (97.6 kg)   SpO2 98%   BMI 28.39 kg/m   Gen: NAD, resting comfortably CV: RRR with no murmurs appreciated Pulm: NWOB, CTAB with no crackles, wheezes, or rhonchi Chest: No tenderness to palpation along sternal border. MSK: No edema, cyanosis, or clubbing noted Skin: Warm, dry Neuro: Grossly normal, moves all extremities Psych: Normal affect and thought content  EKG: Normal sinus rhythm.  Ventricular rate 70.  No ischemic changes.  Review of labs (will be scanned in chart): Lipid panel 01/03/17: Total cholesterol 164, HDL 39 Random glucose 01/03/2017: 92  Assessment/Plan:  Chest pain No current chest pain.  Given his lack of risk factors, lack of family history, and atypical symptoms, he has a low likelihood of cardiac disease.  His EKG today is normal. Symptoms more likely MSK related to his recent upper body workout-she does have some tenderness on palpation of the sternal border.  Physical exam otherwise normal.  Return precautions reviewed including recurrence of chest pain, shortness of breath, fevers or chills.  Discussed reasons to go to  the emergency room.  Offered stress testing referral however patient deferred.  Consider referral for stress testing if symptoms recur.  Time Spent: I spent 15 minutes face-to-face with the patient, with more than half spent on counseling for etiology of chest pain, warning signs and symptoms, and reasons to return to care.  Katina Degreealeb M. Jimmey RalphParker, MD 02/21/2017 9:39 AM

## 2017-02-21 NOTE — Patient Instructions (Signed)
Your EKG today is normal.  Based on your cholesterol levels, blood pressure, and blood sugar you are at a low risk for cardiac disease.  These tests are not perfect.  The best test to rule out cardiac disease would be a stress test.  Please let me know if your symptoms recur.  Please let me know if you like to have a stress test done.  Take care, Dr. Jimmey RalphParker

## 2017-02-27 ENCOUNTER — Other Ambulatory Visit: Payer: Self-pay | Admitting: Family Medicine

## 2017-03-03 ENCOUNTER — Emergency Department (HOSPITAL_COMMUNITY): Payer: BLUE CROSS/BLUE SHIELD

## 2017-03-03 ENCOUNTER — Encounter (HOSPITAL_COMMUNITY): Payer: Self-pay

## 2017-03-03 ENCOUNTER — Emergency Department (HOSPITAL_COMMUNITY)
Admission: EM | Admit: 2017-03-03 | Discharge: 2017-03-03 | Disposition: A | Discharge status: Home / Self Care | Payer: BLUE CROSS/BLUE SHIELD | Attending: Emergency Medicine | Admitting: Emergency Medicine

## 2017-03-03 ENCOUNTER — Ambulatory Visit (HOSPITAL_COMMUNITY)
Admission: EM | Admit: 2017-03-03 | Discharge: 2017-03-03 | Disposition: A | Discharge status: Home / Self Care | Payer: BLUE CROSS/BLUE SHIELD

## 2017-03-03 DIAGNOSIS — J45909 Unspecified asthma, uncomplicated: Secondary | ICD-10-CM | POA: Diagnosis not present

## 2017-03-03 DIAGNOSIS — E785 Hyperlipidemia, unspecified: Secondary | ICD-10-CM | POA: Diagnosis not present

## 2017-03-03 DIAGNOSIS — R079 Chest pain, unspecified: Secondary | ICD-10-CM | POA: Insufficient documentation

## 2017-03-03 LAB — CBC
HEMATOCRIT: 40.3 % (ref 39.0–52.0)
Hemoglobin: 13.7 g/dL (ref 13.0–17.0)
MCH: 30.1 pg (ref 26.0–34.0)
MCHC: 34 g/dL (ref 30.0–36.0)
MCV: 88.6 fL (ref 78.0–100.0)
Platelets: 192 10*3/uL (ref 150–400)
RBC: 4.55 MIL/uL (ref 4.22–5.81)
RDW: 13.3 % (ref 11.5–15.5)
WBC: 7.9 10*3/uL (ref 4.0–10.5)

## 2017-03-03 LAB — BASIC METABOLIC PANEL
ANION GAP: 7 (ref 5–15)
BUN: 17 mg/dL (ref 6–20)
CO2: 26 mmol/L (ref 22–32)
Calcium: 8.9 mg/dL (ref 8.9–10.3)
Chloride: 104 mmol/L (ref 101–111)
Creatinine, Ser: 0.95 mg/dL (ref 0.61–1.24)
GFR calc Af Amer: >60 mL/min (ref >60)
GFR calc non Af Amer: >60 mL/min (ref >60)
GLUCOSE: 98 mg/dL (ref 65–99)
POTASSIUM: 4.1 mmol/L (ref 3.5–5.1)
Sodium: 137 mmol/L (ref 135–145)

## 2017-03-03 LAB — I-STAT TROPONIN, ED: Troponin i, poc: 0 ng/mL (ref 0.00–0.08)

## 2017-03-03 MED ORDER — IOPAMIDOL (ISOVUE-370) INJECTION 76%
INTRAVENOUS | Status: AC
  Administered 2017-03-03: 80 mL
  Filled 2017-03-03: qty 100

## 2017-03-03 NOTE — ED Provider Notes (Signed)
The patient is a 41 year old male, he has very few medical problems, has been in his usual state of health until recently when he started having intermittent left-sided sharp and stabbing pain inferior and lateral to his left nipple, he is pain-free at this time, his pain initially lasted minutes, and went away, he saw his family doctor, there was no findings on his EKG and outpatient follow-up was planned.  It came on again here recently, it is now gone away.  There is no shortness of breath, no swelling of the legs, denies any other risk factors for pulmonary embolism.  Labs unremarkable, chest x-ray shows abnormal finding in the left lung and a CT scan was recommended.  Will perform CT scan to look for pulmonary embolism or other pulmonary pathology, anticipate discharge if no acute pathology is found.   EKG Interpretation  Date/Time:  Sunday March 03 2017 15:22:52 EST Ventricular Rate:  65 PR Interval:  146 QRS Duration: 72 QT Interval:  408 QTC Calculation: 424 R Axis:   60 Text Interpretation:  Normal sinus rhythm Normal ECG No old tracing to compare Confirmed by Eber HongMiller, Terrie Haring ([id number]) on 03/03/2017 6:18:45 PM       Medical screening examination/treatment/procedure(s) were conducted as a shared visit with non-physician practitioner(s) and myself.  I personally evaluated the patient during the encounter.  Clinical Impression:   Final diagnoses:  Chest pain, unspecified type         Eber HongMiller, Lason Eveland, MD 03/04/17 1439

## 2017-03-03 NOTE — ED Provider Notes (Signed)
[facility] Provider Note   CSN: [id number] Arrival date & time: 03/03/17  1514     History   Chief Complaint Chief Complaint  Patient presents with  . Chest Pain    HPI Roberto Wilson is a 41 y.o. male.  HPI  Roberto Wilson is a 41yo male history of hyperlipidemia, OSA, GERD who presents emergency department for evaluation of intermittent left-sided chest pain.  He states that he had a 10-minute episode of sharp left-sided chest pain near the nipple line and over the left lateral chest wall which radiated to the back about 5 days ago.  This resolved on its own.  He was seen at his primary care office three days ago and had a non-ischemic EKG.  Patient states that his symptoms returned last night in while he was watching TV. Reports he had gradually worsening 5/10 severity left-sided chest pain which radiated to the back and felt "sharp" in nature. States that the episode lasted approximately 40 minutes. Pain was worsened when he moved his left arm above the head. Endorses associated lightheadedness.  No shortness of breath, nausea/vomiting, diaphoresis. Took Pepto bismol and aspirin which he reports "may have helped."  He did not have any pain when he woke up this morning, although states that while he was driving he he had a five minute episode of similar symptoms to last night. No history of exertional chest pain. He denies smoking history.  Reports his maternal grandmother had a heart attack at age 41, no other family history of cardiac disease that he is aware of.  Denies drug use, denies recent injury. At this time he denies chest pain, shortness of breath, diaphoresis, nausea/vomiting, dizziness, numbness, weakness, visual disturbance, abdominal pain, urinary symptoms.  Denies leg swelling, previous DVT/PE, recent immobilization, hemoptysis, recent surgery.  Past Medical History:  Diagnosis Date  . GERD (gastroesophageal reflux disease)   .  Hyperlipidemia     Patient Active Problem List   Diagnosis Date Noted  . Anxiety disorder 02/01/2017  . Asthma 02/01/2017  . 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 OSA (obstructive sleep apnea) 02/01/2017    Past Surgical History:  Procedure Laterality Date  . BICEPS TENDON REPAIR Left 2016  . GANGLION CYST EXCISION Left 2016       Home Medications    Prior to Admission medications   Medication Sig Start Date End Date Taking? Authorizing Provider  albuterol (PROAIR HFA) 108 (90 Base) MCG/ACT inhaler Inhale 1-2 puffs into the lungs every 6 (six) hours as needed. 01/31/17   Ardith DarkParker, Caleb M, MD  Chlorphen-PE-Acetaminophen (NOREL AD PO) Take by mouth.    [provider]  clorazepate (TRANXENE) 3.75 MG tablet Take 1 tablet (3.75 mg total) by mouth 2 (two) times daily as needed for anxiety. 01/31/17   Ardith DarkParker, Caleb M, MD  ipratropium (ATROVENT) 0.03 % nasal spray Place 2 sprays into both nostrils every 12 (twelve) hours. 01/31/17   Ardith DarkParker, Caleb M, MD  valACYclovir (VALTREX) 500 MG tablet TAKE 1 TABLET BY MOUTH AS NEEDED 02/27/17   Ardith DarkParker, Caleb M, MD    Family History Family History  Problem Relation Age of Onset  . Hearing loss Mother   . Cancer Father        Prostate  . Arthritis Maternal Grandmother   . Hypertension Maternal Grandmother   . Diabetes Paternal Grandmother   . Cancer Paternal Grandfather        Prostate    Social History Social History  Tobacco Use  . Smoking status: Never Smoker  . Smokeless tobacco: Never Used  Substance Use Topics  . Alcohol use: Yes    Comment: Socially  . Drug use: No     Allergies   Patient has no known allergies.   Review of Systems Review of Systems  Constitutional: Negative for chills, fatigue and fever.  Eyes: Negative for visual disturbance.  Respiratory: Negative for cough and shortness of breath.   Cardiovascular: Positive for chest pain. Negative for palpitations and leg swelling.  Gastrointestinal: Negative  for abdominal pain, diarrhea and vomiting.  Genitourinary: Negative for difficulty urinating and dysuria.  Musculoskeletal: Positive for back pain (chest pain radiates to the upper left back). Negative for gait problem.  Skin: Negative for rash.  Neurological: Positive for light-headedness. Negative for weakness.  Psychiatric/Behavioral: Negative for agitation.     Physical Exam Updated Vital Signs BP 117/77   Pulse 64   Temp 97.9 F (36.6 C) (Oral)   Resp 17   SpO2 100%   Physical Exam  Constitutional: He is oriented to person, place, and time. He appears well-developed and well-nourished.  Non-toxic appearance. No distress.  HENT:  Head: Normocephalic and atraumatic.  Mouth/Throat: Oropharynx is clear and moist.  Eyes: Pupils are equal, round, and reactive to light. Right eye exhibits no discharge. Left eye exhibits no discharge.  Neck: Normal range of motion. Neck supple. No JVD present. No tracheal deviation present.  Cardiovascular: Normal rate, regular rhythm and intact distal pulses. Exam reveals no friction rub.  No murmur heard. Pulmonary/Chest: Effort normal and breath sounds normal. No respiratory distress. He has no wheezes. He has no rhonchi. He has no rales.  Chest wall non-tender to palpation.   Abdominal: Soft. Bowel sounds are normal. He exhibits no distension. There is no tenderness.  Musculoskeletal: Normal range of motion.  Neurological: He is alert and oriented to person, place, and time.  Skin: Skin is warm and dry. Capillary refill takes less than 2 seconds. No rash noted.  Psychiatric: He has a normal mood and affect. His behavior is normal.  Nursing note and vitals reviewed.    ED Treatments / Results  Labs (all labs ordered are listed, but only abnormal results are displayed) Labs Reviewed  BASIC METABOLIC PANEL  CBC  I-STAT TROPONIN, ED    EKG  EKG Interpretation  Date/Time:  Sunday March 03 2017 15:22:52 EST Ventricular Rate:  65 PR  Interval:  146 QRS Duration: 72 QT Interval:  408 QTC Calculation: 424 R Axis:   60 Text Interpretation:  Normal sinus rhythm Normal ECG No old tracing to compare Confirmed by Eber Hong ([id number]) on 03/03/2017 6:18:45 PM       Radiology Dg Chest 2 View  Result Date: 03/03/2017 CLINICAL DATA:  Left-sided chest pain. EXAM: CHEST  2 VIEW COMPARISON:  None. FINDINGS: Right lung clear. Flame shaped density left base measures about 2 cm. The cardiopericardial silhouette is within normal limits for size. The visualized bony structures of the thorax are intact. IMPRESSION: Nodular density left lung base. CT chest without contrast recommended to further evaluate. Electronically Signed   By: Kennith Center M.D.   On: 03/03/2017 16:08    Procedures Procedures (including critical care time)  Medications Ordered in ED Medications  iopamidol (ISOVUE-370) 76 % injection (not administered)     Initial Impression / Assessment and Plan / ED Course  I have reviewed the triage vital signs and the nursing notes.  Pertinent labs & imaging results  that were available during my care of the patient were reviewed by me and considered in my medical decision making (see chart for details).  Clinical Course as of Mar 03 1826  Wynelle LinkSun Mar 03, 2017  1826 Chest xray with nodular density at left lung base. Unclear etiology. Radiology recommends CT to further evaluate. Discussed this patient with Dr. Hyacinth MeekerMiller who agrees with CT angio to further evaluate.   [ES]    Clinical Course User Index [ES] Kellie ShropshireShrosbree, Hannah Crill J, PA-C    Patient is to be discharged with recommendation to follow up with PCP in regards to today's hospital visit. Chest pain is not likely of cardiac or pulmonary etiology d/t presentation, PERC negative, VSS, no tracheal deviation, no JVD or new murmur, RRR, breath sounds equal bilaterally, EKG without acute abnormalities, negative troponin. Chest xray with nodular density at left lung base with  unclear etiology, CT scan reveals flame shaped opacity consistent with atelectasis. No pneumonia, pneumothorax or pneumomediastinum. Pt has been advised to return to the ED if CP becomes exertional, associated with diaphoresis or nausea, radiates to left jaw/arm, worsens or becomes concerning in any way. Pt appears reliable for follow up and is agreeable to discharge.   Case has been discussed with and seen by Dr. Hyacinth MeekerMiller who agrees with the above plan to discharge.    Final Clinical Impressions(s) / ED Diagnoses   Final diagnoses:  Chest pain, unspecified type    ED Discharge Orders    None       Lawrence MarseillesShrosbree, Wise Fees J, PA-C 03/03/17 Wynetta Emery1939    Eber HongMiller, Brian, MD 03/04/17 [phone]

## 2017-03-03 NOTE — Discharge Instructions (Signed)
Your lab work was reassuring today. Your EKG was normal. Your chest xray and CT scan showed no pneumonia or lung injury.   Please schedule an appoint with your primary doctor to discuss your ER visit today.  Return to the emergency department if your chest pain becomes worsened with exercise, you have nausea or vomiting with chest pain, chest pain in which she break out into a sweat or your chest pain radiates to the left arm or jaw. Please also return for any new or worsening symptoms.

## 2017-03-03 NOTE — ED Triage Notes (Signed)
Onset Last night had 40 min episode of left sided chest pain, burning, stabbing, tired, dizziness, and left posterior shoulder pain.  No other s/s noted. Pt seen at PCP last week for same symptoms.

## 2017-03-03 NOTE — ED Notes (Signed)
Pt departed in NAD, refused use of wheelchair.  

## 2017-03-04 ENCOUNTER — Ambulatory Visit: Payer: BLUE CROSS/BLUE SHIELD | Admitting: Family Medicine

## 2017-03-05 ENCOUNTER — Encounter: Payer: Self-pay | Admitting: Family Medicine

## 2017-03-05 ENCOUNTER — Ambulatory Visit: Payer: BLUE CROSS/BLUE SHIELD | Admitting: Family Medicine

## 2017-03-05 VITALS — BP 110/80 | HR 79 | Ht 73.0 in | Wt 214.0 lb

## 2017-03-05 DIAGNOSIS — F419 Anxiety disorder, unspecified: Secondary | ICD-10-CM | POA: Diagnosis not present

## 2017-03-05 DIAGNOSIS — R079 Chest pain, unspecified: Secondary | ICD-10-CM

## 2017-03-05 MED ORDER — OMEPRAZOLE 40 MG PO CPDR: 40.0000 mg | DELAYED_RELEASE_CAPSULE | Freq: Every day | ORAL | 3 refills | Status: DC

## 2017-03-05 MED ORDER — CLORAZEPATE DIPOTASSIUM 3.75 MG PO TABS: 3.7500 mg | ORAL_TABLET | Freq: Two times a day (BID) | ORAL | 2 refills | Status: DC | PRN

## 2017-03-05 NOTE — Progress Notes (Signed)
   Subjective:  Roberto Wilson is a [age] y.o. male who presents today with a chief complaint of chest pain.   HPI:  Chest Pain, recurrent issue Patient seen 12 days ago in clinic for the same issue. Was thought to be likely musculoskeletal in setting of recent upper body work out and tenderness to palpation on exam. Symptoms had been improving, however two days ago, patient had a recurrence of his symptoms that were more severe than previously. He ended up going to the emergency room for the chest pain. (Summary of those records are outlined below.) He has not had a recurrence of his symptoms since then. He is not sure of any obvious precipitating factors, though did not that he had several dietary indescretions the day prior to the episode including ice cream, [place] food, and a lot of coffee. The pain was located on the left side of his chest and described as a burning sensation. The pain subsided after about 40 minutes without intervention. Pain occurred while watching football. No shortness of breath. No palpitations. No exertional symptoms.  Took Pepto which may have helped.  Summary of ED visit: Patient presented to the ED on 03/03/2017 with about 10 minutes of sharp left-sided chest pain that resolved spontaneously.  He underwent cardiac and pulmonary workup which was negative.  It was determined that his chest pain was not likely to be cardiac in etiology.  He was discharged home.  ROS: Per HPI  PMH: Smoking history reviewed. Never smoker.   Objective:  Physical Exam: BP 110/80   Pulse 79   Ht 6\' 1"  (1.854 m)   Wt 214 lb (97.1 kg)   SpO2 98%   BMI 28.23 kg/m   Gen: NAD, resting comfortably CV: RRR with no murmurs appreciated Pulm: NWOB, CTAB with no crackles, wheezes, or rhonchi Chest: No deformities.  Mildly tender to palpation along left lower sternal border. GI: Normal bowel sounds present. Soft, Nontender, Nondistended. MSK: No edema, cyanosis, or clubbing noted Skin:  Warm, dry Neuro: Grossly normal, moves all extremities Psych: Normal affect and thought content  Review/summary of emergency room workup: Troponin 03/03/2017: 0 CT angiogram 03/03/2017: Atelectasis in lingula, no PE EKG 03/03/2017: Normal sinus rhythm.  No ischemic changes. .  Assessment/Plan:  Chest pain Workup thus far reassuring for noncardiac and nonpulmonary etiology.  Patient would like referral for stress test to completely rule this out-this referral was placed today.  He does have some tenderness on his chest wall-he likely has some underlying MSK component.  Given the association with some dietary indiscretion, it is also possible that this may be reflux.  Start omeprazole 40 mg daily for the next 2-3 weeks to see if this prevents recurrence.  Discussed reasons to return to care or go to the emergency room including severe chest pain, exertional symptoms, shortness of breath, and fever.  Anxiety Disorder Clorazepate refilled. Database reviewed without redflags.   Katina Degreealeb M. Jimmey RalphParker, MD 03/05/2017 10:30 AM

## 2017-03-05 NOTE — Assessment & Plan Note (Signed)
Indication for controlled substance: Anxiety Medication and dose: Clorzaepate 3.75mg  bid # pills per month: 60 Last UDS date: needs ASAP Pain contract signed (Y/N):  Date narcotic database last reviewed (include red flags): 03/05/2017, no red flags.

## 2017-04-01 ENCOUNTER — Encounter: Payer: Self-pay | Admitting: Family Medicine

## 2017-04-01 ENCOUNTER — Ambulatory Visit: Payer: BLUE CROSS/BLUE SHIELD | Admitting: Family Medicine

## 2017-04-01 VITALS — BP 134/82 | HR 80 | Temp 98.5°F | Ht 75.0 in | Wt 215.2 lb

## 2017-04-01 DIAGNOSIS — K219 Gastro-esophageal reflux disease without esophagitis: Secondary | ICD-10-CM | POA: Insufficient documentation

## 2017-04-01 DIAGNOSIS — R05 Cough: Secondary | ICD-10-CM | POA: Diagnosis not present

## 2017-04-01 DIAGNOSIS — R059 Cough, unspecified: Secondary | ICD-10-CM | POA: Insufficient documentation

## 2017-04-01 MED ORDER — METHYLPREDNISOLONE ACETATE 80 MG/ML IJ SUSP
80.0000 mg | Freq: Once | INTRAMUSCULAR | Status: AC
  Administered 2017-04-01: 80 mg via INTRAMUSCULAR

## 2017-04-01 MED ORDER — IPRATROPIUM BROMIDE 0.06 % NA SOLN: 2.0000 | Freq: Four times a day (QID) | NASAL | 0 refills | Status: DC

## 2017-04-01 NOTE — Assessment & Plan Note (Signed)
Improving on omeprazole.  We will continue for now.  Consider trial of H2 blocker instead of PPI in the near future.

## 2017-04-01 NOTE — Progress Notes (Signed)
    Subjective:  Roberto Wilson is a 41 y.o. male who presents today with a chief complaint of cough.   HPI:  Cough, acute issue Symptoms started 3 days ago.  Worsened over that time.  Associated symptoms include rhinorrhea, sore throat, subjective fevers, and myalgias.  No known sick contacts.  Tried several over-the-counter medications including Chloraseptic spray, DayQuil, and antihistamines.  Not sure if these significantly seem to help.  Symptoms are worse at night.  No other obvious alleviating or aggravating factors.  Chest Pain, established problem, stable Patient was started on omeprazole 40 mg daily about a month ago for presumed GERD.  Since then chest pain is basically resolved.  No further episodes.  No shortness of breath.  ROS: Per HPI  PMH: Smoking history reviewed.  Never smoker.  Objective:  Physical Exam: BP 134/82 (BP Location: Left Arm, Patient Position: Sitting, Cuff Size: Normal)   Pulse 80   Temp 98.5 F (36.9 C) (Oral)   Ht 6\' 3"  (1.905 m)   Wt 215 lb 3.2 oz (97.6 kg)   SpO2 95%   BMI 26.90 kg/m   Gen: NAD, resting comfortably HEENT: TMs with clear effusion bilaterally.  Maxillary sinuses clear to transillumination bilaterally.  Oropharynx clear without exudate.  Nasal mucosa boggy and edematous bilaterally with clear nasal discharge. CV: RRR with no murmurs appreciated Pulm: NWOB, CTAB with no crackles, wheezes, or rhonchi  Assessment/Plan:  Cough No signs of bacterial infection.  Start Atrovent nasal spray.  Will give IM Depo-Medrol 80 mg today.  Encouraged good oral hydration.  Recommended Tylenol and/or Motrin as needed for low-grade fever and pain.  Return precautions reviewed.  Follow-up as needed.  GERD (gastroesophageal reflux disease) Improving on omeprazole.  We will continue for now.  Consider trial of H2 blocker instead of PPI in the near future.    Katina Degreealeb M. Jimmey RalphParker, MD 04/01/2017 9:08 AM

## 2017-04-01 NOTE — Patient Instructions (Addendum)
Start the atrovent.  Let me know if not better in 3-5 days or if worsening.  Take care,  Dr Jimmey RalphParker

## 2017-04-01 NOTE — Addendum Note (Signed)
Addended by: Koleen DistanceAGNER, Momin Misko M on: 04/01/2017 11:44 AM   Modules accepted: Orders

## 2017-04-05 ENCOUNTER — Telehealth: Payer: Self-pay | Admitting: Family Medicine

## 2017-04-05 ENCOUNTER — Other Ambulatory Visit: Payer: Self-pay

## 2017-04-05 MED ORDER — AZITHROMYCIN 250 MG PO TABS: ORAL_TABLET | ORAL | 0 refills | Status: DC

## 2017-04-05 NOTE — Telephone Encounter (Signed)
Please call in zpack for patient  Katina DegreeCaleb M. Jimmey RalphParker, MD 04/05/2017 12:33 PM

## 2017-04-05 NOTE — Telephone Encounter (Signed)
Rx sent to patient's pharmacy

## 2017-04-05 NOTE — Telephone Encounter (Signed)
Copied from CRM [phone]. Topic: Quick Communication - Rx Refill/Question >> Apr 05, 2017 11:31 AM Alexander BergeronBarksdale, Harvey B wrote: PT CALLED TO let Dr. Jimmey RalphParker know that he is still not feeling well and following the directions to call back so that a Rx can be called in for him, contact pt if needed

## 2017-05-12 ENCOUNTER — Other Ambulatory Visit: Payer: Self-pay | Admitting: Family Medicine

## 2017-06-03 ENCOUNTER — Encounter: Payer: Self-pay | Admitting: Physician Assistant

## 2017-06-03 ENCOUNTER — Ambulatory Visit: Payer: BLUE CROSS/BLUE SHIELD | Admitting: Physician Assistant

## 2017-06-03 VITALS — BP 146/90 | HR 84 | Temp 98.9°F | Ht 75.0 in | Wt 212.0 lb

## 2017-06-03 DIAGNOSIS — J069 Acute upper respiratory infection, unspecified: Secondary | ICD-10-CM | POA: Diagnosis not present

## 2017-06-03 MED ORDER — PREDNISONE 20 MG PO TABS: 40.0000 mg | ORAL_TABLET | Freq: Every day | ORAL | 0 refills | Status: DC

## 2017-06-03 NOTE — Progress Notes (Signed)
Roberto Wilson is a 42 y.o. male here for a new problem.  I acted as a Neurosurgeon for [employer], PA-C [employer], LPN.  History of Present Illness:   Chief Complaint  Patient presents with  . Cough  . Diarrhea    Cough  This is a new problem. Episode onset: Started Friday. The problem has been gradually improving. The problem occurs constantly. Cough characteristics: expectorating green sputum. Associated symptoms include a fever, nasal congestion, postnasal drip, a sore throat, shortness of breath and wheezing. Pertinent negatives include no chills, headaches or sweats. Associated symptoms comments: Fever and chills on Saturday did not take temperature.. The symptoms are aggravated by cold air and exercise. Risk factors: 3 children have URI one tx with antibiotics. Treatments tried: Dayquil, Atrovent, Albuterol Inhaler 3 x's a week. The treatment provided mild relief. His past medical history is significant for asthma and bronchitis. There is no history of pneumonia.  Diarrhea   This is a new problem. Episode onset: Started Saturday. The problem occurs 2 to 4 times per day. The problem has been gradually improving. The stool consistency is described as watery. The patient states that diarrhea awakens (one time.) him from sleep. Associated symptoms include coughing, a fever and increased flatus. Pertinent negatives include no abdominal pain, bloating, chills, headaches, sweats, URI or vomiting. Nothing aggravates the symptoms. Risk factors include ill contacts (2 children had GI bug). He has tried increased fluids for the symptoms. The treatment provided no relief. There is no history of bowel resection, inflammatory bowel disease, irritable bowel syndrome, malabsorption, a recent abdominal surgery or short gut syndrome.   Appetite is okay. Trying his best to push fluids. Thursday night was onset of symptoms. Chlidren have been tested for the flu and was negative.  He does report a history  of asthma, recently diagnosed by ENT. Has not been needing his inhaler but very seldomly.   Past Medical History:  Diagnosis Date  . GERD (gastroesophageal reflux disease)   . Hyperlipidemia      Social History   Socioeconomic History  . Marital status: Married    Spouse name: Not on file  . Number of children: Not on file  . Years of education: Not on file  . Highest education level: Not on file  Social Needs  . Financial resource strain: Not on file  . Food insecurity - worry: Not on file  . Food insecurity - inability: Not on file  . Transportation needs - medical: Not on file  . Transportation needs - non-medical: Not on file  Occupational History  . Occupation: Nature conservation officer  Tobacco Use  . Smoking status: Never Smoker  . Smokeless tobacco: Never Used  Substance and Sexual Activity  . Alcohol use: Yes    Comment: Socially  . Drug use: No  . Sexual activity: Yes    Partners: Female  Other Topics Concern  . Not on file  Social History Narrative  . Not on file    Past Surgical History:  Procedure Laterality Date  . BICEPS TENDON REPAIR Left 2016  . GANGLION CYST EXCISION Left 2016    Family History  Problem Relation Age of Onset  . Hearing loss Mother   . Cancer Father        Prostate  . Arthritis Maternal Grandmother   . Hypertension Maternal Grandmother   . Diabetes Paternal Grandmother   . Cancer Paternal Grandfather        Prostate    No Known Allergies  Current Medications:   Current Outpatient Medications:  .  albuterol (PROAIR HFA) 108 (90 Base) MCG/ACT inhaler, Inhale 1-2 puffs into the lungs every 6 (six) hours as needed., Disp: 1 Inhaler, Rfl: 0 .  Chlorphen-PE-Acetaminophen (NOREL AD PO), Take by mouth., Disp: , Rfl:  .  clorazepate (TRANXENE) 3.75 MG tablet, Take 1 tablet (3.75 mg total) 2 (two) times daily as needed by mouth for anxiety., Disp: 60 tablet, Rfl: 2 .  ipratropium (ATROVENT) 0.06 % nasal spray, PLACE 2 SPRAYS INTO BOTH  NOSTRILS 4 (FOUR) TIMES DAILY., Disp: 15 mL, Rfl: 0 .  valACYclovir (VALTREX) 500 MG tablet, TAKE 1 TABLET BY MOUTH AS NEEDED, Disp: 30 tablet, Rfl: 2 .  omeprazole (PRILOSEC) 40 MG capsule, Take 1 capsule (40 mg total) daily by mouth. (Patient not taking: Reported on 06/03/2017), Disp: 30 capsule, Rfl: 3 .  predniSONE (DELTASONE) 20 MG tablet, Take 2 tablets (40 mg total) by mouth daily., Disp: 10 tablet, Rfl: 0   Review of Systems:   Review of Systems  Constitutional: Positive for fever. Negative for chills.  HENT: Positive for postnasal drip and sore throat.   Respiratory: Positive for cough, shortness of breath and wheezing.   Gastrointestinal: Positive for diarrhea and flatus. Negative for abdominal pain, bloating and vomiting.  Neurological: Negative for headaches.    Vitals:   Vitals:   06/03/17 1528  BP: (!) 146/90  Pulse: 84  Temp: 98.9 F (37.2 C)  TempSrc: Oral  SpO2: 95%  Weight: 212 lb (96.2 kg)  Height: 6\' 3"  (1.905 m)     Body mass index is 26.5 kg/m.  Physical Exam:   Physical Exam  Constitutional: He appears well-developed. He is cooperative.  Non-toxic appearance. He does not have a sickly appearance. He does not appear ill. No distress.  Cardiovascular: Normal rate, regular rhythm, S1 normal, S2 normal, normal heart sounds and normal pulses.  No LE edema  Pulmonary/Chest: Effort normal and breath sounds normal. He has no decreased breath sounds. He has no wheezes. He has no rhonchi. He has no rales.  Good air movement bilaterally  Abdominal: Normal appearance and bowel sounds are normal. There is no tenderness. There is no rigidity, no rebound and no guarding.  Neurological: He is alert. GCS eye subscore is 4. GCS verbal subscore is 5. GCS motor subscore is 6.  Skin: Skin is warm, dry and intact.  Psychiatric: He has a normal mood and affect. His speech is normal and behavior is normal.  Nursing note and vitals reviewed.   Assessment and Plan:     Roberto Wilson was seen today for cough and diarrhea.  Diagnoses and all orders for this visit:  Upper respiratory tract infection, unspecified type Patient has been exposed to GI bug and URI in household. He reports that overall symptoms are improving. No red flags on exam. I suspect viral etiology, no indication for antibiotics at this time. He is agreeable to 40 mg prednisone daily x 5 days. Continue omeprazole for GI protection. Follow-up if symptoms worsen or persist despite treatment. Patient is agreeable to plan.  Other orders -     predniSONE (DELTASONE) 20 MG tablet; Take 2 tablets (40 mg total) by mouth daily.    . Reviewed expectations re: course of current medical issues. . Discussed self-management of symptoms. . Outlined signs and symptoms indicating need for more acute intervention. . Patient verbalized understanding and all questions were answered. . See orders for this visit as documented in the electronic medical record. .Marland Kitchen  Patient received an After-Visit Summary.  CMA or LPN served as scribe during this visit. History, Physical, and Plan performed by medical provider. Documentation and orders reviewed and attested to.  Jarold Motto, PA-C

## 2017-06-03 NOTE — Patient Instructions (Signed)
It was great to meet you.  Continue your omeprazole as scheduled to protect your stomach (heartburn medication.)  Take oral steroid as prescribed, take with food to prevent stomach upset and do not take too close to bedtime.  Push fluids.  Start daily antihistamine (such as Claritin, Allegra, or Zyrtec.)  If symptoms worsen or persist despite treatment, please let us know.

## 2017-07-01 ENCOUNTER — Other Ambulatory Visit: Payer: Self-pay | Admitting: Family Medicine

## 2017-07-01 NOTE — Telephone Encounter (Signed)
Copied from CRM [phone]. Topic: Quick Communication - Rx Refill/Question >> Jul 01, 2017  1:09 PM Landry MellowFoltz, Melissa J wrote: Medication: clorazepate (TRANXENE) 3.75 MG tablet   Has the patient contacted their pharmacy? Yes.     (Agent: If no, request that the patient contact the pharmacy for the refill.)   Preferred Pharmacy (with phone number or street name): [facility] - pharm was supposed to have sent over request    Agent: Please be advised that RX refills may take up to 3 business days. We ask that you follow-up with your pharmacy.

## 2017-07-02 ENCOUNTER — Other Ambulatory Visit: Payer: Self-pay

## 2017-07-02 MED ORDER — CLORAZEPATE DIPOTASSIUM 3.75 MG PO TABS: 3.7500 mg | ORAL_TABLET | Freq: Two times a day (BID) | ORAL | 2 refills | Status: DC | PRN

## 2017-07-02 NOTE — Telephone Encounter (Signed)
LOV:03/05/17  Dr. Jimmey RalphParker  [facility] [address]

## 2017-07-02 NOTE — Telephone Encounter (Signed)
See note

## 2017-07-08 ENCOUNTER — Ambulatory Visit (INDEPENDENT_AMBULATORY_CARE_PROVIDER_SITE_OTHER): Payer: BLUE CROSS/BLUE SHIELD | Admitting: Family Medicine

## 2017-07-08 ENCOUNTER — Encounter: Payer: Self-pay | Admitting: Family Medicine

## 2017-07-08 DIAGNOSIS — G4700 Insomnia, unspecified: Secondary | ICD-10-CM | POA: Diagnosis not present

## 2017-07-08 DIAGNOSIS — F320 Major depressive disorder, single episode, mild: Secondary | ICD-10-CM

## 2017-07-08 DIAGNOSIS — F419 Anxiety disorder, unspecified: Secondary | ICD-10-CM | POA: Diagnosis not present

## 2017-07-08 MED ORDER — FLUOXETINE HCL 20 MG PO TABS: 40.0000 mg | ORAL_TABLET | Freq: Every day | ORAL | 3 refills | Status: DC

## 2017-07-08 NOTE — Patient Instructions (Signed)
Please start the fluoxetine.  Take 1 pill daily for 1-2 weeks, then increase to 2 pills daily.  Come back to see me in 4 weeks, or sooner as needed.  You can try taking over the counter melatonin as needed for your sleep.  Take care, Dr Jimmey RalphParker

## 2017-07-08 NOTE — Assessment & Plan Note (Addendum)
Likely the main contributor to patient's symptoms.  We will restart his Prozac at 20 mg daily.  Increase to 40 mg daily in 1-2 weeks.  He will follow-up with me in 4 weeks.  Discussed psychotherapy referral, however patient deferred.   If no improvement with treatment, would consider repeat blood work including TSH, CBC, and CMET.

## 2017-07-08 NOTE — Progress Notes (Signed)
    Subjective:  [name] is a 42 y.o. male who presents today for same-day appointment with a chief complaint of fatigue.   HPI:  Fatigue, New problem  Symptoms started about 2 weeks ago.  Stable over the last few weeks.  Patient has been diagnosed with depression in the past and feels like it may have came back.  Also notes that he recently changed his sleep schedule due to a change in work.  He is now working third shift.  He has had trouble adjusting to this shift.  Sleeps about 5 hours at a time.  Overall, his anxiety symptoms are stable.  He has been using Tranxene which helps with his anxiety symptoms.  Previously, he was on fluoxetine for his depression which helped.  No other obvious precipitating events.  No obvious alleviating or aggravating factors.  Depression screen PHQ 2/9 07/08/2017  Decreased Interest 1  Down, Depressed, Hopeless 1  PHQ - 2 Score 2  Altered sleeping 1  Tired, decreased energy 1  Change in appetite 0  Feeling bad or failure about yourself  0  Trouble concentrating 1  Moving slowly or fidgety/restless 0  Suicidal thoughts 0  PHQ-9 Score 5    GAD 7 : Generalized Anxiety Score 07/08/2017  Nervous, Anxious, on Edge 1  Control/stop worrying 1  Worry too much - different things 1  Trouble relaxing 1  Restless 0  Easily annoyed or irritable 0  Afraid - awful might happen 0  Total GAD 7 Score 4  Anxiety Difficulty Somewhat difficult   ROS: Per HPI  PMH: He reports that he has never smoked. He has never used smokeless tobacco. He reports that he drinks alcohol. He reports that he does not use drugs.  Objective:  Physical Exam: BP (!) 142/68 (BP Location: Left Arm)   Pulse 82   Temp 97.7 F (36.5 C)   Ht 6' 2.5" (1.892 m)   Wt 204 lb (92.5 kg)   SpO2 96%   BMI 25.84 kg/m   Gen: NAD, resting comfortably CV: RRR with no murmurs appreciated Pulm: NWOB, CTAB with no crackles, wheezes, or rhonchi GI: Normal bowel sounds present. Soft,  Nontender, Nondistended. MSK: No edema, cyanosis, or clubbing noted Skin: Warm, dry Neuro: Grossly normal, moves all extremities Psych: Blunted affect, normal thought content.    Assessment/Plan:  Depression, major, single episode, mild (HCC) Likely the main contributor to patient's symptoms.  We will restart his Prozac at 20 mg daily.  Increase to 40 mg daily in 1-2 weeks.  He will follow-up with me in 4 weeks.  Discussed psychotherapy referral, however patient deferred.   Anxiety disorder Continue Tranxene.  Start fluoxetine as noted above.  Insomnia Multifactorial in setting of underlying depression/anxiety as well as shift work.  Hopefully will get some improvement with treatment for his depression.  Also advised him to use over-the-counter melatonin given his switch in circadian rhythms.  Follow-up with me in 4 weeks.  Katina Degreealeb M. Jimmey RalphParker, MD 07/08/2017 1:08 PM

## 2017-07-08 NOTE — Assessment & Plan Note (Signed)
Continue Tranxene.  Start fluoxetine as noted above.

## 2017-07-08 NOTE — Assessment & Plan Note (Signed)
Multifactorial in setting of underlying depression/anxiety as well as shift work.  Hopefully will get some improvement with treatment for his depression.  Also advised him to use over-the-counter melatonin given his switch in circadian rhythms.  Follow-up with me in 4 weeks.

## 2017-07-29 ENCOUNTER — Ambulatory Visit: Payer: BLUE CROSS/BLUE SHIELD | Admitting: Family Medicine

## 2017-07-29 ENCOUNTER — Encounter: Payer: Self-pay | Admitting: Family Medicine

## 2017-07-29 DIAGNOSIS — F419 Anxiety disorder, unspecified: Secondary | ICD-10-CM | POA: Diagnosis not present

## 2017-07-29 DIAGNOSIS — F320 Major depressive disorder, single episode, mild: Secondary | ICD-10-CM | POA: Diagnosis not present

## 2017-07-29 DIAGNOSIS — G4700 Insomnia, unspecified: Secondary | ICD-10-CM | POA: Diagnosis not present

## 2017-07-29 NOTE — Assessment & Plan Note (Signed)
Improving with treatment of his depression/anxiety. Continue prozac 40mg  daily.

## 2017-07-29 NOTE — Assessment & Plan Note (Signed)
Improving. Continue prozac 40mg  daily. Follow up in 3 months.

## 2017-07-29 NOTE — Patient Instructions (Signed)
I am glad you are feeling better.  No changes today.  Come back to see me in 3 months, or sooner as needed.  Take care, Dr Jimmey RalphParker

## 2017-07-29 NOTE — Progress Notes (Signed)
    Subjective:  Roberto Wilson is a 42 y.o. male who presents today with a chief complaint of depression follow up.   HPI:  Depression/Anxiety, established problems, Stable Patient seen about 3 weeks ago for this. He was started on prozac 20mg  daily. He did this for a couple of weeks and has increased to 40mg  daily over the past week.  He has tolerated this well without significant side effects. Overall, he feels like his symptoms are improving.   Depression screen PHQ 2/9 07/29/2017  Decreased Interest 1  Down, Depressed, Hopeless 0  PHQ - 2 Score 1  Altered sleeping 1  Tired, decreased energy 1  Change in appetite 0  Feeling bad or failure about yourself  0  Trouble concentrating 0  Moving slowly or fidgety/restless 0  Suicidal thoughts 0  PHQ-9 Score 3  Difficult doing work/chores Somewhat difficult   GAD 7 : Generalized Anxiety Score 07/29/2017  Nervous, Anxious, on Edge 1  Control/stop worrying 0  Worry too much - different things 0  Trouble relaxing 1  Restless 0  Easily annoyed or irritable 0  Afraid - awful might happen 0  Total GAD 7 Score 2  Anxiety Difficulty Somewhat difficult   Insomnia, established problem, improving.  Symptoms have improved some since our last visit 3 months ago.   ROS: Per HPI  PMH: He reports that he has never smoked. He has never used smokeless tobacco. He reports that he drinks alcohol. He reports that he does not use drugs.   Objective:  Physical Exam: BP 110/80 (BP Location: Left Arm)   Pulse 76   Temp 97.6 F (36.4 C) (Oral)   Resp 12   Ht 6\' 2"  (1.88 m)   Wt 208 lb (94.3 kg)   SpO2 98%   BMI 26.71 kg/m   Gen: NAD, resting comfortably Neuro: Grossly normal, moves all extremities Psych: Normal affect and thought content  Assessment/Plan:  Depression, major, single episode, mild (HCC) Improving. Continue prozac 40mg  daily. Follow up in 3 months.   Anxiety disorder See depression A/P. Improving.  Continue Prozac 40  mg daily and Tranxene 3.75 mg twice daily as needed.  Insomnia Improving with treatment of his depression/anxiety. Continue prozac 40mg  daily.   Katina Degreealeb M. Jimmey RalphParker, MD 07/29/2017 3:28 PM

## 2017-07-29 NOTE — Assessment & Plan Note (Signed)
See depression A/P. Improving.  Continue Prozac 40 mg daily and Tranxene 3.75 mg twice daily as needed.

## 2017-10-25 ENCOUNTER — Other Ambulatory Visit: Payer: Self-pay | Admitting: Family Medicine

## 2017-10-25 MED ORDER — CLORAZEPATE DIPOTASSIUM 3.75 MG PO TABS: 3.7500 mg | ORAL_TABLET | Freq: Two times a day (BID) | ORAL | 1 refills | Status: DC | PRN

## 2017-10-25 NOTE — Addendum Note (Signed)
Addended by: Koleen DistanceAGNER, Alanee Ting M on: 10/25/2017 11:51 AM   Modules accepted: Orders

## 2017-10-25 NOTE — Telephone Encounter (Signed)
Copied from CRM [id number]. Topic: Quick Communication - Rx Refill/Question >> Oct 25, 2017 10:55 AM Maia Pettiesrtiz, Kristie S wrote: Medication: clorazepate (TRANXENE) 3.75 MG tablet, 2 days left, takes 2/day, pt states pharmacy requested Tuesday or Wednesday Has the patient contacted their pharmacy? No - controlled Preferred Pharm[facility] (with phone number [facility] street [id number] - [address] [phone] (Phone) [phone] (Fax)

## 2017-10-25 NOTE — Telephone Encounter (Signed)
Please advise.  I have not received any requests from patient's pharmacy.

## 2017-12-30 ENCOUNTER — Other Ambulatory Visit: Payer: Self-pay | Admitting: Family Medicine

## 2018-01-03 ENCOUNTER — Other Ambulatory Visit: Payer: Self-pay | Admitting: Family Medicine

## 2018-01-07 ENCOUNTER — Telehealth: Payer: Self-pay | Admitting: Family Medicine

## 2018-01-07 NOTE — Telephone Encounter (Signed)
Copied from CRM [phone]. Topic: Quick Communication - Rx Refill/Question >> Jan 07, 2018  3:47 PM Roberto Wilson, Roberto B wrote: Medication: FLUoxetine (PROZAC) 20 MG tablet [[id number]][[id number]]   Has the patient contacted their pharmacy? Yes.   (Agent: If no, request that the patient contact the pharmacy for the refill.) (Agent: If yes, when and what did the pharmacy advise?)  Preferred Pharmacy (with phone number or street name): [facility]  Agent: Please be advised that RX refills may take up to 3 business days. We ask that you follow-up with your pharmacy.

## 2018-01-07 NOTE — Telephone Encounter (Signed)
Called patient regarding his needing to change his pharmacy for his fluoxetine 20 mg tabs only,  to [facility] for insurance purposes.   Fluoxetine 20 mg refill Last Refill:12/30/17 # 60 Last OV: 07/29/17 PCP: Lupita Shutter. [facility]  Per chart pt needs an office visit before med refills. Pt would like to come in this week to see his pcp because of his insurance. He is requesting a call back to see if can be worked in this week.

## 2018-01-08 ENCOUNTER — Other Ambulatory Visit: Payer: Self-pay

## 2018-01-08 MED ORDER — FLUOXETINE HCL 20 MG PO TABS: 40.0000 mg | ORAL_TABLET | Freq: Every day | ORAL | 0 refills | Status: AC

## 2018-01-08 MED FILL — FLUoxetine HCL 20 MG TABS: 20 | 30 days supply | Qty: 60 | Fill #0

## 2018-01-08 NOTE — Telephone Encounter (Signed)
Patient notified

## 2018-01-08 NOTE — Telephone Encounter (Signed)
Rx has been sent to [facility]. 

## 2019-08-17 IMAGING — CT CT ANGIO CHEST
2 of 8 series · 19 of 46 positions shown · IV contrast (iopamidol)
Comparison: Chest x-ray from earlier today

CLINICAL DATA: Left-sided chest pain for several days.

EXAM:
CT ANGIOGRAPHY CHEST WITH CONTRAST
TECHNIQUE: Multidetector CT imaging of the chest was performed using the
standard protocol during bolus administration of intravenous
contrast. Multiplanar CT image reconstructions and MIPs were
obtained to evaluate the vascular anatomy.
CONTRAST:  80mL 0VOHDC-7TD IOPAMIDOL (0VOHDC-7TD) INJECTION 76%

[Series 6: thins · axial · 0.67mm/px · z∈[+1115,+1364]mm · 16 of 275 slices shown]
[im 13/275  lung]
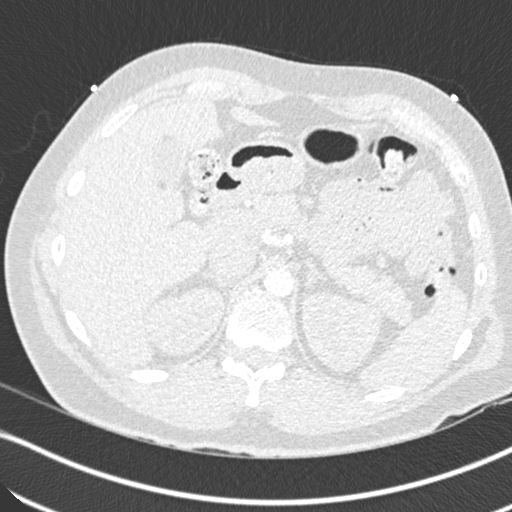
[im 25/275  soft-tissue]
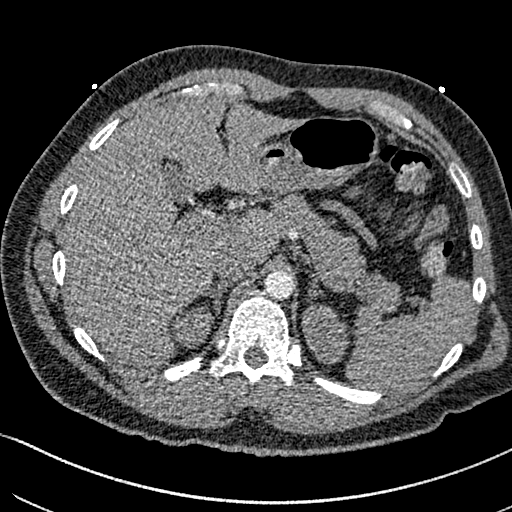
[im 50/275  lung]
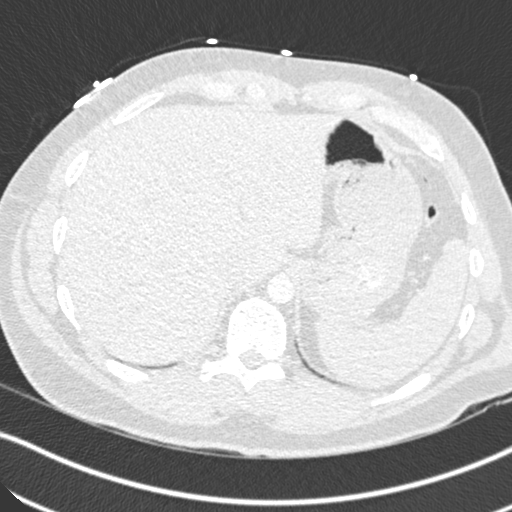
[im 63/275  soft-tissue]
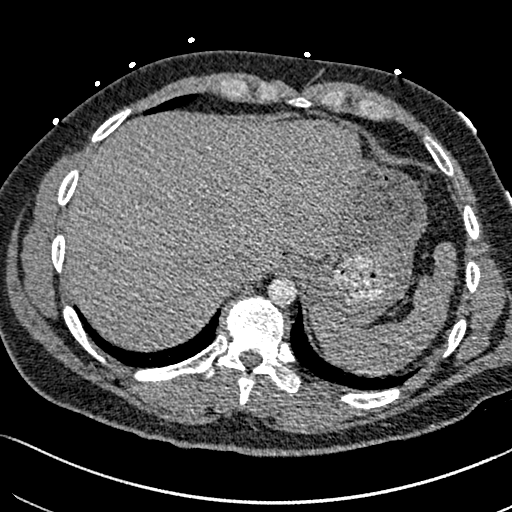
[im 75/275  lung]
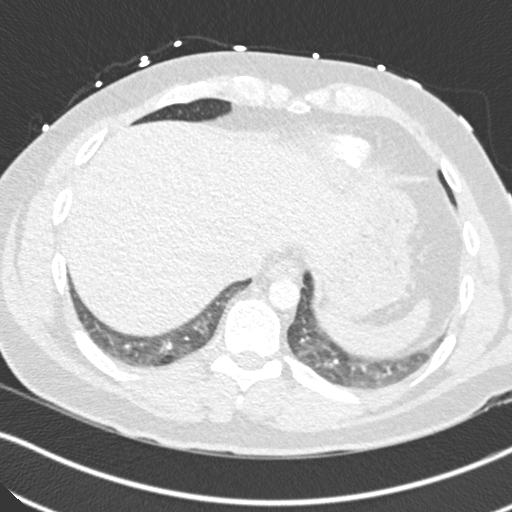
[im 100/275  soft-tissue]
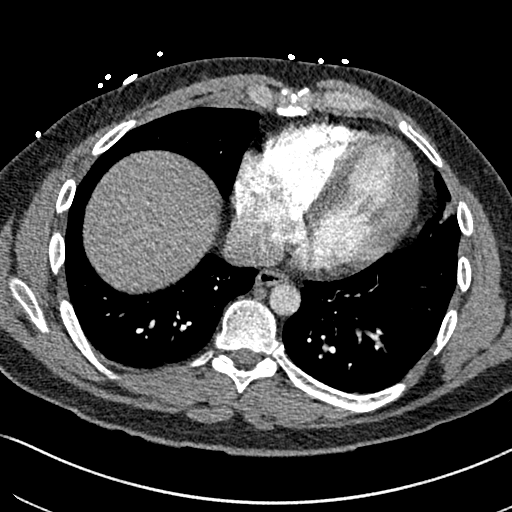
[im 113/275  lung]
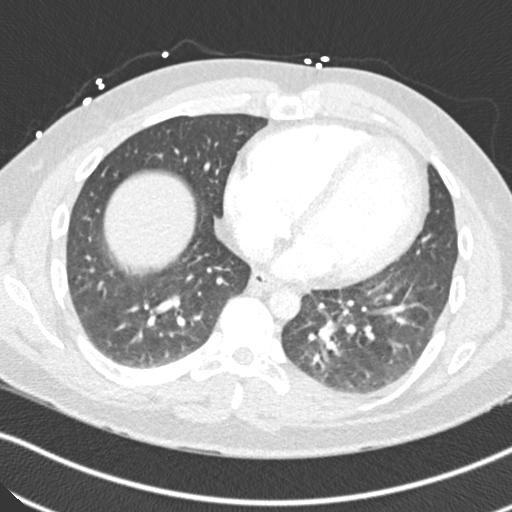
[im 125/275  soft-tissue]
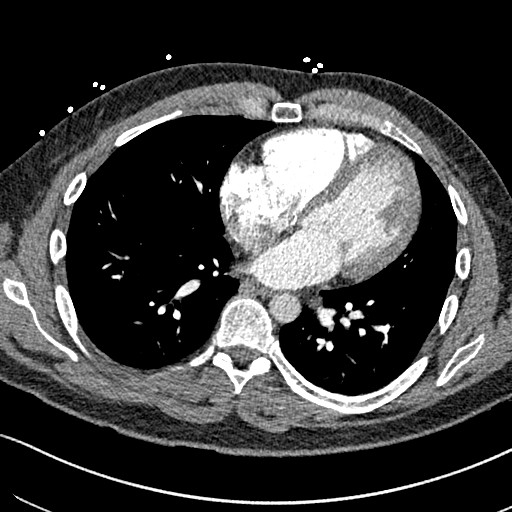
[im 150/275  lung]
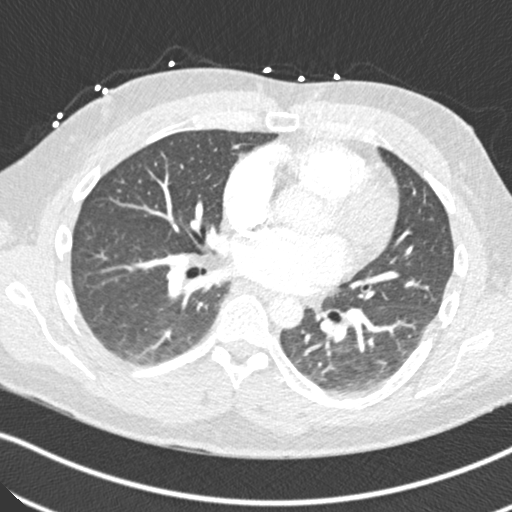
[im 162/275  soft-tissue]
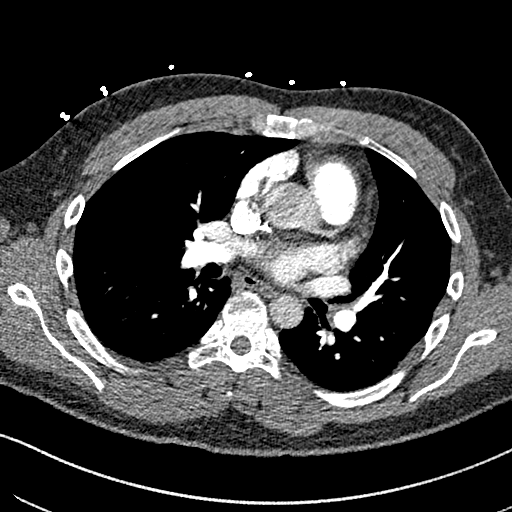
[im 175/275  lung]
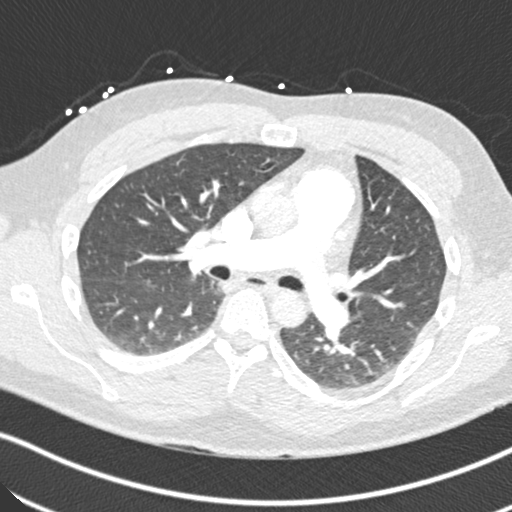
[im 200/275  soft-tissue]
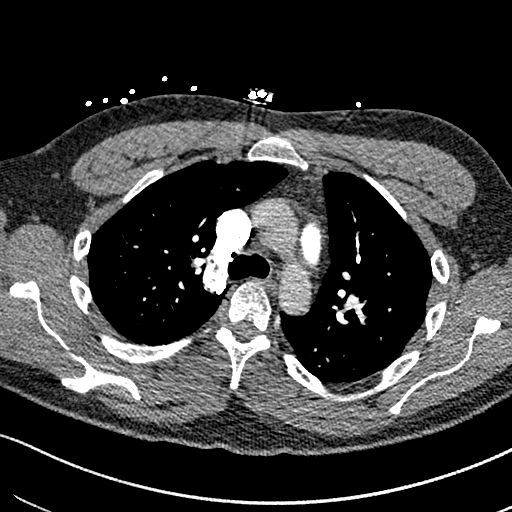
[im 212/275  lung]
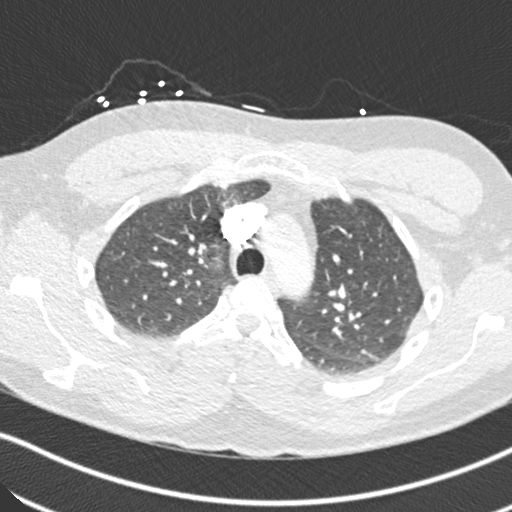
[im 225/275  soft-tissue]
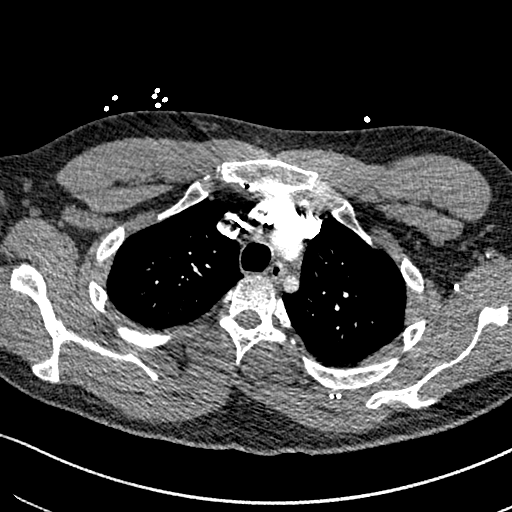
[im 250/275  lung]
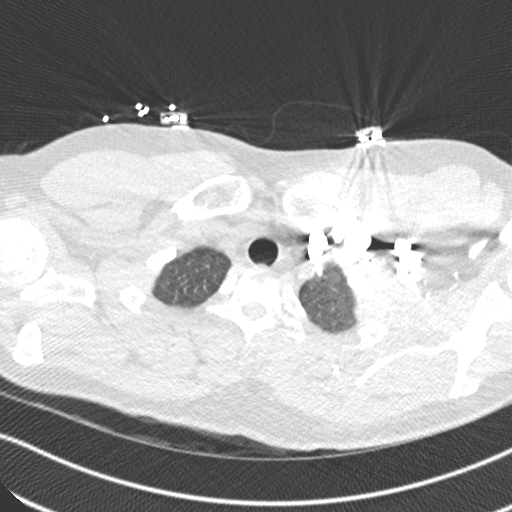
[im 262/275  soft-tissue]
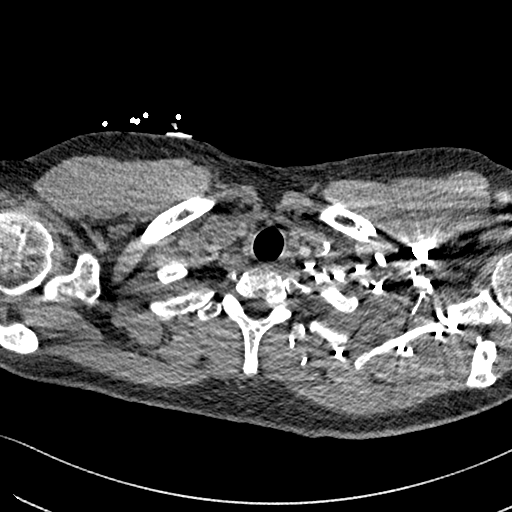

[Series 8: coronal mpr · coronal · 0.56mm/px · 3 of 126 slices shown]
[im 32/126  soft-tissue]
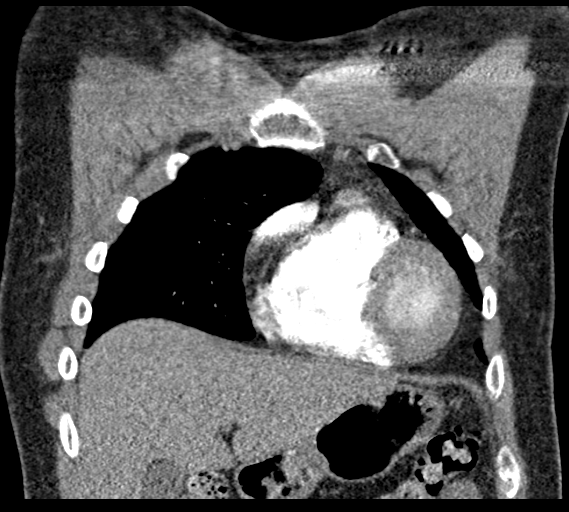
[im 63/126  soft-tissue]
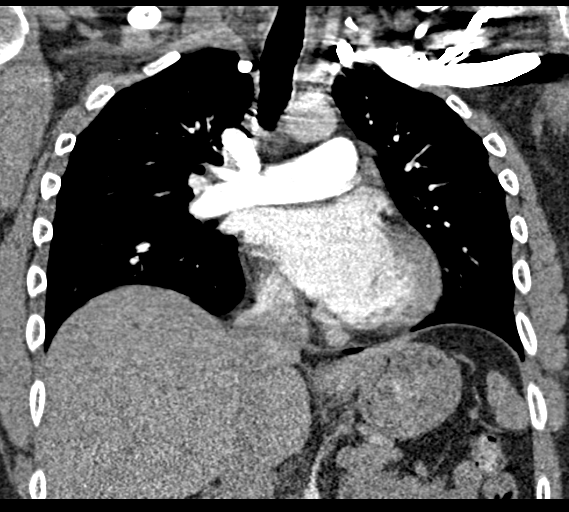
[im 94/126  soft-tissue]
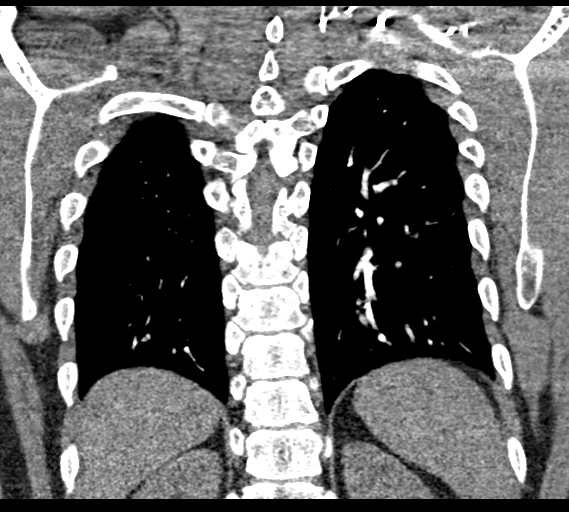

[19 of 46 positions shown; findings below may reference images not displayed]

FINDINGS: Cardiovascular: Satisfactory opacification of the pulmonary arteries
to the segmental level. No evidence of pulmonary embolism. Normal
heart size. No pericardial effusion.

Mediastinum/Nodes: No enlarged mediastinal, hilar, or axillary lymph
nodes. Thyroid gland, trachea, and esophagus demonstrate no
significant findings.

Lungs/Pleura: Central airways are normal. No pneumothorax. Mild
dependent atelectasis. The flame shaped opacity seen on recent chest
x-ray is located within the lingula. The triangular configuration
suggests atelectasis. Early infiltrate considered less likely. No
other abnormalities are identified within the lungs.

Upper Abdomen: No acute abnormality.

Musculoskeletal: No chest wall abnormality. No acute or significant
osseous findings.

Review of the MIP images confirms the above findings.
IMPRESSION: 1. No pulmonary emboli.
2. The flame shaped opacity is located in the lingula and is favored
to represent atelectasis. Infiltrate considered less likely.
Recommend follow-up to resolution.
